# Patient Record
Sex: Male | Born: 1935 | Race: White | Hispanic: No | Smoking: Former smoker
Health system: Southern US, Community
[De-identification: ages and names within clinical notes are randomized; demographics above are authoritative.]

## PROBLEM LIST (undated history)

## (undated) DIAGNOSIS — G20A1 Parkinson's disease without dyskinesia, without mention of fluctuations: Secondary | ICD-10-CM

## (undated) DIAGNOSIS — K219 Gastro-esophageal reflux disease without esophagitis: Secondary | ICD-10-CM

## (undated) DIAGNOSIS — G20 Parkinson's disease: Secondary | ICD-10-CM

## (undated) HISTORY — PX: CATARACT EXTRACTION, BILATERAL: SHX1313

## (undated) HISTORY — PX: CARDIAC SURGERY: SHX584

---

## 2004-11-09 ENCOUNTER — Ambulatory Visit: Payer: Self-pay | Admitting: Gastroenterology

## 2005-03-29 ENCOUNTER — Ambulatory Visit: Payer: Self-pay | Admitting: Gastroenterology

## 2005-07-03 ENCOUNTER — Ambulatory Visit: Payer: Self-pay | Admitting: Family Medicine

## 2005-11-06 ENCOUNTER — Other Ambulatory Visit: Payer: Self-pay

## 2005-11-06 ENCOUNTER — Emergency Department: Payer: Self-pay | Admitting: Emergency Medicine

## 2006-01-02 ENCOUNTER — Ambulatory Visit: Payer: Self-pay | Admitting: Family Medicine

## 2006-04-03 ENCOUNTER — Ambulatory Visit: Payer: Self-pay | Admitting: Family Medicine

## 2006-08-14 ENCOUNTER — Ambulatory Visit: Payer: Self-pay | Admitting: Family Medicine

## 2006-11-13 ENCOUNTER — Ambulatory Visit: Payer: Self-pay | Admitting: Family Medicine

## 2009-09-02 ENCOUNTER — Ambulatory Visit: Payer: Self-pay | Admitting: Gastroenterology

## 2009-09-23 ENCOUNTER — Ambulatory Visit: Payer: Self-pay | Admitting: Orthopedic Surgery

## 2009-09-28 ENCOUNTER — Ambulatory Visit: Payer: Self-pay | Admitting: Gastroenterology

## 2010-01-28 ENCOUNTER — Ambulatory Visit: Payer: Self-pay | Admitting: Orthopedic Surgery

## 2010-03-01 ENCOUNTER — Ambulatory Visit: Payer: Self-pay | Admitting: Neurology

## 2010-07-06 ENCOUNTER — Ambulatory Visit: Payer: Self-pay | Admitting: Gastroenterology

## 2010-09-06 ENCOUNTER — Ambulatory Visit: Payer: Self-pay | Admitting: Urology

## 2011-02-28 ENCOUNTER — Ambulatory Visit: Payer: Self-pay | Admitting: Ophthalmology

## 2011-04-10 ENCOUNTER — Ambulatory Visit: Payer: Self-pay | Admitting: Gastroenterology

## 2011-04-12 LAB — PATHOLOGY REPORT

## 2011-06-11 ENCOUNTER — Ambulatory Visit: Payer: Self-pay | Admitting: Otolaryngology

## 2011-08-30 ENCOUNTER — Ambulatory Visit: Payer: Self-pay | Admitting: Gastroenterology

## 2011-10-10 ENCOUNTER — Encounter: Payer: Self-pay | Admitting: Otolaryngology

## 2011-10-29 ENCOUNTER — Observation Stay: Payer: Self-pay | Admitting: Internal Medicine

## 2011-10-29 LAB — CK TOTAL AND CKMB (NOT AT ARMC)
CK, Total: 79 U/L (ref 35–232)
CK-MB: 1 ng/mL (ref 0.5–3.6)

## 2011-10-29 LAB — CBC
MCH: 30.3 pg (ref 26.0–34.0)
MCHC: 32.8 g/dL (ref 32.0–36.0)
MCV: 92 fL (ref 80–100)
Platelet: 194 10*3/uL (ref 150–440)
RBC: 4.95 10*6/uL (ref 4.40–5.90)
WBC: 8.2 10*3/uL (ref 3.8–10.6)

## 2011-10-29 LAB — TROPONIN I: Troponin-I: <0.02 ng/mL

## 2011-10-29 LAB — BASIC METABOLIC PANEL
BUN: 14 mg/dL (ref 7–18)
Co2: 28 mmol/L (ref 21–32)
Creatinine: 0.87 mg/dL (ref 0.60–1.30)
EGFR (Non-African Amer.): >60
Glucose: 95 mg/dL (ref 65–99)
Osmolality: 289 (ref 275–301)

## 2011-10-30 DIAGNOSIS — R079 Chest pain, unspecified: Secondary | ICD-10-CM

## 2011-10-30 LAB — TROPONIN I: Troponin-I: <0.02 ng/mL

## 2011-10-30 LAB — LIPID PANEL
Ldl Cholesterol, Calc: 119 mg/dL — ABNORMAL HIGH (ref 0–100)
VLDL Cholesterol, Calc: 20 mg/dL (ref 5–40)

## 2011-10-30 LAB — HEMOGLOBIN A1C: Hemoglobin A1C: 6.5 % — ABNORMAL HIGH (ref 4.2–6.3)

## 2011-12-10 ENCOUNTER — Ambulatory Visit: Payer: Self-pay | Admitting: Gastroenterology

## 2012-01-29 ENCOUNTER — Ambulatory Visit: Payer: Self-pay | Admitting: Cardiology

## 2012-03-26 ENCOUNTER — Encounter: Payer: Self-pay | Admitting: Cardiology

## 2012-04-14 ENCOUNTER — Encounter: Payer: Self-pay | Admitting: Cardiology

## 2012-04-15 ENCOUNTER — Ambulatory Visit: Payer: Self-pay | Admitting: Internal Medicine

## 2012-04-15 LAB — CREATININE, SERUM
Creatinine: 1.18 mg/dL (ref 0.60–1.30)
EGFR (Non-African Amer.): 58 — ABNORMAL LOW

## 2012-05-07 DIAGNOSIS — G20 Parkinson's disease: Secondary | ICD-10-CM | POA: Insufficient documentation

## 2012-05-07 DIAGNOSIS — Z951 Presence of aortocoronary bypass graft: Secondary | ICD-10-CM | POA: Insufficient documentation

## 2012-05-07 DIAGNOSIS — I2510 Atherosclerotic heart disease of native coronary artery without angina pectoris: Secondary | ICD-10-CM | POA: Insufficient documentation

## 2012-05-15 ENCOUNTER — Encounter: Payer: Self-pay | Admitting: Cardiology

## 2012-05-25 ENCOUNTER — Emergency Department: Payer: Self-pay | Admitting: Emergency Medicine

## 2012-05-25 LAB — CK TOTAL AND CKMB (NOT AT ARMC): CK, Total: 74 U/L (ref 35–232)

## 2012-05-25 LAB — HEPATIC FUNCTION PANEL A (ARMC)
Alkaline Phosphatase: 103 U/L (ref 50–136)
Bilirubin, Direct: 0.1 mg/dL (ref 0.00–0.20)
Bilirubin,Total: 0.7 mg/dL (ref 0.2–1.0)
Total Protein: 8.2 g/dL (ref 6.4–8.2)

## 2012-05-25 LAB — BASIC METABOLIC PANEL
BUN: 14 mg/dL (ref 7–18)
Calcium, Total: 9 mg/dL (ref 8.5–10.1)
Chloride: 106 mmol/L (ref 98–107)
Co2: 26 mmol/L (ref 21–32)
Creatinine: 0.89 mg/dL (ref 0.60–1.30)
EGFR (African American): >60
EGFR (Non-African Amer.): >60
Potassium: 4.3 mmol/L (ref 3.5–5.1)
Sodium: 142 mmol/L (ref 136–145)

## 2012-05-25 LAB — CBC
HGB: 14.5 g/dL (ref 13.0–18.0)
MCHC: 33.7 g/dL (ref 32.0–36.0)
Platelet: 219 10*3/uL (ref 150–440)
RBC: 5.06 10*6/uL (ref 4.40–5.90)

## 2012-05-25 LAB — TROPONIN I: Troponin-I: <0.02 ng/mL

## 2012-06-15 ENCOUNTER — Encounter: Payer: Self-pay | Admitting: Cardiology

## 2012-06-17 ENCOUNTER — Ambulatory Visit: Payer: Self-pay | Admitting: Gastroenterology

## 2012-07-15 ENCOUNTER — Encounter: Payer: Self-pay | Admitting: Cardiology

## 2013-03-16 ENCOUNTER — Encounter: Payer: Self-pay | Admitting: Family Medicine

## 2013-04-01 ENCOUNTER — Ambulatory Visit: Payer: Self-pay | Admitting: Ophthalmology

## 2013-04-14 ENCOUNTER — Encounter: Payer: Self-pay | Admitting: Family Medicine

## 2013-05-15 ENCOUNTER — Encounter: Payer: Self-pay | Admitting: Family Medicine

## 2013-06-25 ENCOUNTER — Emergency Department: Payer: Self-pay | Admitting: Emergency Medicine

## 2013-06-25 LAB — CK TOTAL AND CKMB (NOT AT ARMC)
CK, Total: 79 U/L (ref 35–232)
CK-MB: <0.5 ng/mL — ABNORMAL LOW (ref 0.5–3.6)

## 2013-06-25 LAB — CBC
HCT: 43.4 % (ref 40.0–52.0)
HGB: 14.5 g/dL (ref 13.0–18.0)
MCHC: 33.5 g/dL (ref 32.0–36.0)
Platelet: 179 10*3/uL (ref 150–440)
WBC: 7.6 10*3/uL (ref 3.8–10.6)

## 2013-06-25 LAB — COMPREHENSIVE METABOLIC PANEL
Anion Gap: 5 — ABNORMAL LOW (ref 7–16)
Calcium, Total: 8.7 mg/dL (ref 8.5–10.1)
Chloride: 107 mmol/L (ref 98–107)
Creatinine: 0.92 mg/dL (ref 0.60–1.30)
EGFR (African American): >60
Glucose: 118 mg/dL — ABNORMAL HIGH (ref 65–99)
SGOT(AST): 25 U/L (ref 15–37)
SGPT (ALT): 22 U/L (ref 12–78)

## 2014-02-08 ENCOUNTER — Ambulatory Visit: Payer: Self-pay | Admitting: Podiatry

## 2014-04-12 ENCOUNTER — Encounter: Payer: Self-pay | Admitting: Podiatry

## 2014-04-12 ENCOUNTER — Telehealth: Payer: Self-pay | Admitting: *Deleted

## 2014-04-12 ENCOUNTER — Ambulatory Visit (INDEPENDENT_AMBULATORY_CARE_PROVIDER_SITE_OTHER): Payer: Medicare Other

## 2014-04-12 ENCOUNTER — Ambulatory Visit (INDEPENDENT_AMBULATORY_CARE_PROVIDER_SITE_OTHER): Payer: Medicare Other | Admitting: Podiatry

## 2014-04-12 VITALS — BP 139/65 | HR 83 | Resp 16 | Ht 69.0 in | Wt 173.0 lb

## 2014-04-12 DIAGNOSIS — G629 Polyneuropathy, unspecified: Secondary | ICD-10-CM

## 2014-04-12 DIAGNOSIS — G589 Mononeuropathy, unspecified: Secondary | ICD-10-CM

## 2014-04-12 DIAGNOSIS — B353 Tinea pedis: Secondary | ICD-10-CM

## 2014-04-12 MED ORDER — AMITRIPTYLINE HCL 150 MG PO TABS: ORAL_TABLET | ORAL | Status: DC

## 2014-04-12 NOTE — Progress Notes (Signed)
   Subjective:    Patient ID: Steven Roach, male    DOB: 12-06-1928, 80 y.o.   MRN: [id number]  HPI Comments: Both of my feet burn and hurt. Its been going on for a while. Its about the same. Hurts to walk and stand sometimes. i dont do anything for my feet  Foot Pain Associated symptoms include headaches and numbness.      Review of Systems  HENT: Positive for hearing loss.        Sinus problems Ringing in ears  Eyes: Positive for itching.  Endocrine:       Increase urination  Genitourinary: Positive for frequency.  Neurological: Positive for dizziness, light-headedness, numbness and headaches.  All other systems reviewed and are negative.      Objective:   Physical Exam I have reviewed his past history medications allergies surgeries social history and review of systems. Pulses are palpable bilateral. Neurologic sensorium is intact per since once the monofilament. Deep tendon reflexes are intact bilateral. Muscle strength is 5 over 5 dorsiflexors plantar flexors inverters everters all intrinsic musculature is intact. Orthopedic evaluation Mr. is all joints distal to the ankle a full range of motion without crepitation. Cutaneous evaluation demonstrates supple well hydrated cutis there is no erythema edema saline is drainage or odor. Nails are thick yellow dystrophic onychomycotic and painful.        Assessment & Plan:  Assessment: Neuritis/neuropathy forefoot bilateral. Pain in limb secondary to onychomycosis. Parkinson's disease.  Plan: Started him on Elavil 25 mg 1 by mouth QHS.

## 2014-04-12 NOTE — Telephone Encounter (Signed)
Pharmacy called questioning the dos of the medication dr Al Corpushyatt prescribed stating that he wrote for 150mg  of elavil ,we changed it from 150mg  to 25mg  once at nighttime

## 2014-05-10 ENCOUNTER — Ambulatory Visit: Payer: Medicare Other | Admitting: Podiatry

## 2014-05-14 ENCOUNTER — Emergency Department: Payer: Self-pay | Admitting: Emergency Medicine

## 2014-05-14 LAB — CBC WITH DIFFERENTIAL/PLATELET
BASOS PCT: 0.4 %
Basophil #: 0.1 10*3/uL (ref 0.0–0.1)
EOS ABS: 0.1 10*3/uL (ref 0.0–0.7)
EOS PCT: 0.7 %
HCT: 50.3 % (ref 40.0–52.0)
HGB: 15.9 g/dL (ref 13.0–18.0)
Lymphocyte #: 1 10*3/uL (ref 1.0–3.6)
Lymphocyte %: 6.7 %
MCH: 28.6 pg (ref 26.0–34.0)
MCHC: 31.6 g/dL — ABNORMAL LOW (ref 32.0–36.0)
MCV: 91 fL (ref 80–100)
Monocyte #: 0.8 x10 3/mm (ref 0.2–1.0)
Monocyte %: 5.6 %
Neutrophil #: 12.7 10*3/uL — ABNORMAL HIGH (ref 1.4–6.5)
Neutrophil %: 86.6 %
PLATELETS: 223 10*3/uL (ref 150–440)
RBC: 5.56 10*6/uL (ref 4.40–5.90)
RDW: 14.4 % (ref 11.5–14.5)
WBC: 14.6 10*3/uL — AB (ref 3.8–10.6)

## 2014-05-14 LAB — URINALYSIS, COMPLETE
BACTERIA: NONE SEEN
BILIRUBIN, UR: NEGATIVE
Glucose,UR: NEGATIVE mg/dL (ref 0–75)
Ketone: NEGATIVE
NITRITE: NEGATIVE
PROTEIN: NEGATIVE
Ph: 6 (ref 4.5–8.0)
RBC,UR: 24 /HPF (ref 0–5)
Specific Gravity: 1.013 (ref 1.003–1.030)
Squamous Epithelial: NONE SEEN
WBC UR: 334 /HPF (ref 0–5)

## 2014-05-14 LAB — BASIC METABOLIC PANEL
Anion Gap: 5 — ABNORMAL LOW (ref 7–16)
BUN: 14 mg/dL (ref 7–18)
CALCIUM: 9.6 mg/dL (ref 8.5–10.1)
CHLORIDE: 101 mmol/L (ref 98–107)
CO2: 30 mmol/L (ref 21–32)
Creatinine: 1.09 mg/dL (ref 0.60–1.30)
EGFR (African American): >60
EGFR (Non-African Amer.): >60
GLUCOSE: 105 mg/dL — AB (ref 65–99)
OSMOLALITY: 273 (ref 275–301)
Potassium: 4.2 mmol/L (ref 3.5–5.1)
Sodium: 136 mmol/L (ref 136–145)

## 2014-05-14 LAB — TROPONIN I: Troponin-I: <0.02 ng/mL

## 2014-06-14 ENCOUNTER — Emergency Department: Payer: Self-pay | Admitting: Emergency Medicine

## 2014-06-14 LAB — COMPREHENSIVE METABOLIC PANEL
ANION GAP: 5 — AB (ref 7–16)
AST: 24 U/L (ref 15–37)
Albumin: 3.8 g/dL (ref 3.4–5.0)
Alkaline Phosphatase: 136 U/L — ABNORMAL HIGH
BUN: 15 mg/dL (ref 7–18)
Bilirubin,Total: 1 mg/dL (ref 0.2–1.0)
CHLORIDE: 107 mmol/L (ref 98–107)
CO2: 29 mmol/L (ref 21–32)
CREATININE: 0.98 mg/dL (ref 0.60–1.30)
Calcium, Total: 8.9 mg/dL (ref 8.5–10.1)
EGFR (African American): >60
EGFR (Non-African Amer.): >60
Glucose: 100 mg/dL — ABNORMAL HIGH (ref 65–99)
Osmolality: 282 (ref 275–301)
POTASSIUM: 4.1 mmol/L (ref 3.5–5.1)
SGPT (ALT): 18 U/L
Sodium: 141 mmol/L (ref 136–145)
Total Protein: 7.6 g/dL (ref 6.4–8.2)

## 2014-06-14 LAB — CBC
HCT: 44.6 % (ref 40.0–52.0)
HGB: 14.4 g/dL (ref 13.0–18.0)
MCH: 29.2 pg (ref 26.0–34.0)
MCHC: 32.3 g/dL (ref 32.0–36.0)
MCV: 90 fL (ref 80–100)
Platelet: 192 10*3/uL (ref 150–440)
RBC: 4.94 10*6/uL (ref 4.40–5.90)
RDW: 14.3 % (ref 11.5–14.5)
WBC: 6.8 10*3/uL (ref 3.8–10.6)

## 2014-06-14 LAB — TROPONIN I: Troponin-I: <0.02 ng/mL

## 2014-06-14 LAB — LIPASE, BLOOD: Lipase: 151 U/L (ref 73–393)

## 2014-06-14 LAB — CK TOTAL AND CKMB (NOT AT ARMC)
CK, Total: 42 U/L
CK-MB: 0.7 ng/mL (ref 0.5–3.6)

## 2014-12-05 ENCOUNTER — Emergency Department: Payer: Self-pay | Admitting: Emergency Medicine

## 2015-02-06 NOTE — Discharge Summary (Signed)
PATIENT NAME:  Steven Roach, Steven Roach MR#:  [id number] DATE OF BIRTH:  May 08, 1929  DATE OF ADMISSION:  10/29/2011 DATE OF DISCHARGE:  10/30/2011  PRIMARY CARE PHYSICIAN:  Wonda ChengJoel Moffett, MD  REASON FOR ADMISSION: Chest pain.    DISCHARGE DIAGNOSES:   1. Chest pain, atypical, felt to be noncardiac possibly related to gastroesophageal reflux disease versus gastritis. The patient had negative cardiac enzymes x3 and negative cardiac stress test.  2. History of hyperlipidemia.   3. History of gastroesophageal reflux disease/gastritis.   4. History of gastritis 5. History of Parkinson'Roach disease.  6. History of type 2 diabetes mellitus.   CONSULTATION: Cardiology, Dr. Mariah MillingGollan.   PROCEDURES:  Portable chest x-ray 10/29/2011, no acute cardiopulmonary abnormalities are noted.   Lexiscan/Myoview 10/30/2011, no significant ischemia noted. Small region of thinning in the apical wall, likely secondary to attenuation artifact, normal ejection fraction of 78%. No EKG changes concerning for ischemia.  This is a low risk scan.   PERTINENT LABORATORY DATA: EKG on admission with normal sinus rhythm, heart rate 86% beats per minute without acute ST or T wave changes.   Cardiac enzymes are negative x3 sets.   BMP normal on admission.   CBC normal on admission.   Hemoglobin A1c is 6.5.   Liver panel: Total cholesterol 172, triglycerides 98, HDL 33, LDL 119.   BRIEF HISTORY/HOSPITAL COURSE: The patient is an 81 year old male with past medical history of hyperlipidemia, gastroesophageal reflux disease, gastritis, type 2 diabetes, Parkinson'Roach disease who presented to the emergency department with complaints of chest pain. Please see details of Admission History and Physical for pertinent details prior to the onset of this hospitalization. Please see below for further details.  1. Chest pain, atypical in nature. Initial EKG was obtained which was did not reveal any acute ST or T wave changes.  Chest x-ray was  unremarkable. First troponin was negative. Thereafter the patient was admitted to the telemetry unit for further evaluation and management. The patient was maintained on low-dose baby aspirin given his history of hyperlipidemia and diabetes mellitus for myocardial infarction prophylaxis. With conservative measures, patient'Roach chest pain had eventually resolved. The patient is ruled out for a myocardial infarction based on 3 negative sets of cardiac enzymes. Thereafter he was sent for a Myoview which was negative for ischemia and revealed normal LVEF and was a low-risk scan.  Cardiology consultation was obtained and the patient'Roach chest pain was felt to be noncardiac per Dr. Mariah MillingGollan and felt to be possibly related to gastroesophageal reflux disease versus gastritis. In the event of GERD versus gastritis related chest pain his PPI dose has been changed to b.i.d. rather than daily. The patient will be referred to Gastroenterology as an outpatient to see Dr Bluford Kaufmannh.    2. Hyperlipidemia, currently diet controlled.  LDL 119 and not at goal and the patient may need to be started on medications which he wishes to discuss with his primary care physician. Therefore, have advised him to continue to adhere to a low fat, low cholesterol diet for now and he will need to follow up with his primary care physician closely for further management of his hyperlipidemia.   3. Gastroesophageal reflux disease/gastritis as above.  This could be causing his chest pain and his PPI has been changed to b.i.d. for now and he will need prompt outpatient follow up with Gastroenterology and will see Dr. Bluford Kaufmannh in this regard.   4. Type 2 diabetes mellitus. This patient denied any history of such and on  his chart it was listed that he has diet-controlled diabetes mellitus. His hemoglobin A1c returned at 6.5, indicating good control; and he was advised to adhere to an ADA diet.  5. On 10/30/2011 the patient was hemodynamically stable and without any chest  pain or complaints, and he had ambulated well without any chest pain or shortness of breath and was felt to be stable for discharge home with close outpatient followup to which the patient was agreeable.   DISCHARGE DISPOSITION:  Home.   DISCHARGE CONDITION:  Improved, stable.    DISCHARGE ACTIVITIES:  As tolerated.    DISCHARGE DIET: Low sodium, low fat, low cholesterol, ADA.   DISCHARGE MEDICATIONS:  1. Aspirin 81 mg p.o. daily.  2. Omeprazole 20 mg p.o. b.i.d.  3. Carbidopa/levodopa 25 mg/100 mg 1 tab p.o. t.i.d.  4. Finasteride 5 mg p.o. daily.   5. Tamsulosin 0.4 mg p.o. daily.   DISCHARGE INSTRUCTIONS:  1. Take medications as prescribed.  2. Return to the emergency department for recurrence of symptoms or for development of nausea, vomiting, or abdominal pain or dark or black stools or bloody stools.   FOLLOWUP INSTRUCTIONS:  Follow up with Dr. Marguerite Olea within 1 to 2 weeks.   Follow up with Dr Bluford Kaufmann within 1 to 2 weeks.    TIME SPENT ON DISCHARGE: Greater than 30 minutes.     ____________________________ Steven Alas, MD knl:vtd D: 11/02/2011 18:31:07 ET T: 11/03/2011 11:44:21 ET JOB#: [id number]  cc: Steven Alas, MD, <Dictator> Durward Mallard Marguerite Olea, MD Ezzard Standing. Bluford Kaufmann, MD Steven Alas MD ELECTRONICALLY SIGNED 11/13/2011 18:46

## 2015-02-06 NOTE — Consult Note (Signed)
General Aspect 81 year old male with a history of Parkinson's disease, gastroesophageal reflux disease, diet-controlled diabetes and hyperlipidemia presenting with stuttering chest pain over the last week or two. Cardiology was called for chest pain.  He reports that he usually walks in the mall every day, and yesterday morning he started having this left-sided chest pain. It was a sharp chest pain jabbing into his chest radiating to his left arm. He was also slightly dizzy.   He has been having chest pains over the last 1 to 2 weeks. He tried to rest today and that helped his chest pain, but when he went home he continued to have some chest pain and decided to go to the Emergency Room.  In November of 2010, he had an echocardiogram done which showed that he had normal LV function, ejection fraction of 55%, mild left ventricular hypertrophy, moderate mitral valve prolapse, and mild-to-moderate MR and AR. Possible stress test at that time.    He also started taking omeprazole about three days ago because he was having some heartburn. His chest pain on presentation was different from his heartburn. He had an upper GI endoscopy done in June of 2012 which showed that he had a benign-appearing esophageal stricture which was dilated, and he had some gastric erythema.    Present Illness . PAST MEDICAL HISTORY:  1. Parkinson's disease. He does not have any tremor but he has some gait problems.  2. Hyperlipidemia on dietary therapy.  3. Diabetes on dietary therapy. His last A1c in June of 2012 was 6.3.  4. Gastroesophageal reflux disease. He had a recent upper GI endoscopy which showed gastric erythema and benign-appearing esophageal stricture.   PAST SURGICAL HISTORY: None.   ALLERGIES TO MEDICATIONS: None.   SOCIAL HISTORY: He lives with his wife in [place]. No smoking or alcohol use.   FAMILY HISTORY: His brother had a stroke in 7650s. Mother had heart disease in 860s to 4170s.   Physical Exam:    GEN WD, WN, NAD    HEENT red conjunctivae    NECK supple    RESP normal resp effort  clear BS    CARD Regular rate and rhythm  Murmur    Murmur Systolic    Systolic Murmur Out flow    ABD denies tenderness  soft    LYMPH negative neck    EXTR negative edema    SKIN normal to palpation    NEURO motor/sensory function intact    PSYCH alert, A+O to time, place, person, good insight   Review of Systems:   Subjective/Chief Complaint Chest pain    General: No Complaints    Skin: No Complaints    ENT: No Complaints    Eyes: No Complaints    Neck: No Complaints    Respiratory: No Complaints    Cardiovascular: Chest pain or discomfort    Gastrointestinal: No Complaints    Genitourinary: No Complaints    Vascular: No Complaints    Musculoskeletal: No Complaints    Neurologic: No Complaints    Hematologic: No Complaints    Endocrine: No Complaints    Psychiatric: No Complaints    Review of Systems: All other systems were reviewed and found to be negative    Medications/Allergies Reviewed Medications/Allergies reviewed        Admit Diagnosis:   CHEST PAIN PARKINSONS COPD DM GERD: 30-Oct-2011, Active, CHEST PAIN PARKINSONS COPD DM GERD  Home Medications:  carbidopa-levodopa 25 mg-100 mg oral tablet: 1 tab(s) orally 3  times a day, Active  finasteride 5 mg oral tablet: 1 tab(s) orally once a day, Active  tamsulosin 0.4 mg oral capsule: 1 cap(s) orally once a day 1/2 hour after a meal., Active  Bayer Low Dose 81 mg oral tablet: 1 tab(s) orally once a day, Active  omeprazole 20 mg oral delayed release tablet: 1 tab(s) orally once a day, Active    Cardiac:  15-Jan-13 05:00    Troponin I < 0.02   CK, Total 115   CPK-MB, Serum 1.4  Routine Chem:  15-Jan-13 05:00    Cholesterol, Serum 172   Triglycerides, Serum 98   HDL (INHOUSE) 33   VLDL Cholesterol Calculated 20   LDL Cholesterol Calculated 119   Hemoglobin A1c [facility]) 6.5  Cardiology:   15-Jan-13 09:24    Protocol LEXISCAN   Max Work Load 10   Total Exercise Time 61   Max Diastolic BP 63   Max Heart Rate 96   Max Predicted Heart Rate 140   EKG:   Interpretation EKG shows NSR with rate 88 bpm, no significant ST or T wave changes   Radiology Results: XRay:    14-Jan-13 12:18, Chest Portable Single View   Chest Portable Single View    REASON FOR EXAM:    chest pain  COMMENTS:       PROCEDURE: DXR - DXR PORTABLE CHEST SINGLE VIEW  - Oct 29 2011 12:18PM     RESULT: Comparison: None    Findings:     Single portable AP chest radiograph is provided.  There is no focal   parenchymal opacity, pleural effusion, or pneumothorax. Normal   cardiomediastinal silhouette. The osseous structures are unremarkable.    IMPRESSION:     No acute disease of the chest.          Verified By: Joellyn Haff, M.D., MD    No Known Allergies:   Vital Signs/Nurse's Notes: **Vital Signs.:   15-Jan-13 08:32   Vital Signs Type Routine   Temperature Temperature (F) 97.6   Celsius 36.4   Temperature Source oral   Pulse Pulse 69   Pulse source per Dinamap   Respirations Respirations 18   Systolic BP Systolic BP 147   Diastolic BP (mmHg) Diastolic BP (mmHg) 80   Mean BP 102   Pulse Ox % Pulse Ox % 94   Pulse Ox Activity Level  At rest   Oxygen Delivery Room Air/ 21 %     Impression 81 year old male with a history of Parkinson's disease, gastroesophageal reflux disease, diet-controlled diabetes and hyperlipidemia presenting with stuttering chest pain over the past week or two.  Symptoms concerning for angina as they seem to occur with stress.  Stress test pending this AM. Currently pain free. cardiac enz and EKG normal. No prior cardiac hx.  If stress test negative, could potential d/c with follow up in our office. Unable to exclude GI etiology or coronary spasm (He does have a prior GI hx with stricture.) --Consider starting ASA 81 mg daily, low dose b-blocker,  statin   Electronic Signatures: Julien Nordmann (MD)  (Signed 15-Jan-13 19:11)  Authored: General Aspect/Present Illness, History and Physical Exam, Review of System, Health Issues, Home Medications, Labs, EKG , Radiology, Allergies, Vital Signs/Nurse's Notes, Impression/Plan   Last Updated: 15-Jan-13 19:11 by Julien Nordmann (MD)

## 2015-02-06 NOTE — H&P (Signed)
PATIENT NAME:  Steven Roach, Steven Roach MR#:  [id number] DATE OF BIRTH:  [birth year]  DATE OF ADMISSION:  10/29/2011  PRIMARY CARE PHYSICIAN: Francis DowseJoel B. Marguerite OleaMoffett, MD   CHIEF COMPLAINT: Chest pain.   HISTORY OF PRESENT ILLNESS: The patient is an 81 year old male with a history of Parkinson'Roach disease, gastroesophageal reflux disease, diet-controlled diabetes and hyperlipidemia. Today he presented to the Emergency Room. He says he usually walks in the mall every day, and then when he started walking this morning he started having this left-sided chest pain. He feels sharp-like chest pain jabbing into his chest radiating to his left arm. He was slightly dizzy also. He denies any shortness of breath, nausea, or diaphoresis. He says he has been having these chest pains often for the last 1 to 2 weeks. He tried to rest today and that helped his chest pain, but when he went home he continued to have some chest pain so he presented to the Emergency Room. He says the chest pain is going off and on for about one or two weeks. It happens sometimes when he walks, also, and he usually rests. He had no prior history of coronary artery disease or myocardial infarction in the past. He said he had a stress test a long time ago and that was normal. He last saw Dr. Darrold JunkerParaschos in November of 2010, and he had an echocardiogram done which showed that he had normal LV function, ejection fraction of 55%, mild left ventricular hypertrophy, moderate mitral valve prolapse, and mild-to-moderate MR and AR. He got about 324 mg of aspirin in the Emergency Room and 2 mg of IV morphine, and he still has some lingering chest pain. He denies any pleuritic chest pain. He denies any cough, denies any abdominal pain or nausea. He also started taking omeprazole about three days ago because he was having some heartburn. He said his chest pain today was different from his heartburn. He had an upper GI endoscopy done in June of 2012 which showed that he had a  benign-appearing esophageal stricture which was dilated, and he had some gastric erythema. So he is being admitted for chest pain with some typical features, with underlying Parkinson'Roach disease, hyperlipidemia, and diet-controlled diabetes.   REVIEW OF SYSTEMS: CONSTITUTIONAL: He denies any fever, weakness. HEENT: No acute change in vision. He has some mild headache. He also has some dizziness because of his Parkinson'Roach disease. RESPIRATORY: No cough. No dyspnea. CARDIOVASCULAR: Comes in with chest pain. No dyspnea on exertion. No palpitations or syncope. GENITOURINARY: He denies any dysuria, any frequency. GASTROINTESTINAL: No nausea, vomiting, abdominal pain. ENDOCRINE: No thyroid problems. HEMATOLOGIC: No anemia. No bleeding from any site. INTEGUMENTARY:  No rash. MUSCULOSKELETAL: No joint pains or swelling. NEUROLOGICAL:  He has Parkinson disease.  PSYCHIATRIC: No anxiety or depression.   PAST MEDICAL HISTORY:  1. Parkinson'Roach disease. He does not have any tremor but he has some gait problems.  2. Hyperlipidemia on dietary therapy.  3. Diabetes on dietary therapy. His last A1c in June of 2012 was 6.3.  4. Gastroesophageal reflux disease. He had a recent upper GI endoscopy which showed gastric erythema and benign-appearing esophageal stricture.   PAST SURGICAL HISTORY: None.   ALLERGIES TO MEDICATIONS: None.   HOME MEDICATIONS:  1. Aspirin 81 mg daily.  2. Carbidopa/levodopa 25/100 t.i.d. 3. Finasteride 5 mg daily. 4. Tamsulosin 0.4 mg once a day after meals.  5. Omeprazole 20 mg daily, recently started.   SOCIAL HISTORY: He lives with his wife in [place].  No smoking or alcohol use.   FAMILY HISTORY: His brother had a stroke in 33s. Mother had heart disease in 82s to 44s.   PHYSICAL EXAMINATION:  VITAL SIGNS: When he presented to the Emergency Room, temperature was 98.2, heart rate 89, respiratory rate 20, blood pressure 171/78, saturating 97% on room air.   GENERAL: This is an  elderly Caucasian male. He is well built, comfortably lying in bed in no acute distress.   HEENT: Bilateral pupils are equal and reactive to light. Extraocular muscles are intact. No scleral icterus. No conjunctivitis. Oral mucosa is moist. No pallor.   NECK: No thyroid tenderness, enlargement or nodule. Neck is supple. No masses, nontender. No adenopathy. No JVD. No carotid bruit.   CHEST: Bilateral breath sounds are clear. No wheeze. Normal effort. No respiratory distress.  HEART: Heart sounds are regular. No murmur. Good peripheral pulses. No lower extremity edema.   ABDOMEN: Soft, nontender. Normal bowel sounds. No hepatosplenomegaly. No bruit. No masses.   RECTAL: Exam is deferred.   NEUROLOGICAL: He is awake, alert, oriented to time, place, and person. Good insight. Cranial nerves are intact. Moving all extremities against gravity. No cyanosis, no clubbing, no tremors.   SKIN: No rash, no lesions.   LABORATORY, DIAGNOSTIC AND RADIOLOGICAL DATA:  White count of 8.2, hemoglobin 15, platelet count 194,000.  BMP: Sodium 145, potassium 4.3, BUN 14, creatinine 0.8.  Troponin is negative.  EKG shows that he has sinus rhythm, normal EKG.   IMPRESSION:  1. Chest pain with some typical features, rule out myocardial infarction, rule out coronary artery disease.  2. Parkinson'Roach disease.  3. Gastroesophageal reflux disease. 4. Hyperlipidemia, diet controlled.  5. Diabetes, diet controlled.   PLAN: The patient is an 81 year old male who basically only has Parkinson'Roach disease. He controls his diabetes and hyperlipidemia with diet. He also has some acid reflux. He had upper GI endoscopy done in June of 2012 which showed that he has some gastric erythema, and he had a benign-appearing esophageal stricture. He recently started on PPI also. He comes in with chest pain that started when he was walking in the mall and then relieved at rest. He has been having these chest pains for the last 1 to 2  weeks, he says, more with exertion. He got 324 mg of aspirin in the Emergency Room. We will continue that, and we will start him on some nitro paste. We will give him a low-dose beta blocker. We will hold off Lovenox at this time unless his cardiac enzymes become positive. We will do serial cardiac enzymes on him, and if his enzymes are negative we will get a treadmill Myoview in the morning. He is requesting Dr. Mariah Milling to be his Cardiology consultant, and we will order that. We will continue his omeprazole, carbidopa/levodopa, finasteride and tamsulosin.  The plan was discussed with the patient in detail. I will admit him to observation telemetry.    TIME SPENT:   Time spent with admission and coordination of care was 50 minutes.    ____________________________ Fredia Sorrow, MD ag:cbb D: 10/29/2011 15:02:13 ET T: 10/29/2011 16:02:32 ET JOB#: [id number]  cc: Fredia Sorrow, MD, <Dictator> Durward Mallard. Marguerite Olea, MD Fredia Sorrow MD ELECTRONICALLY SIGNED 11/09/2011 10:53

## 2015-07-16 ENCOUNTER — Encounter: Payer: Self-pay | Admitting: Emergency Medicine

## 2015-07-16 ENCOUNTER — Emergency Department
Admission: EM | Admit: 2015-07-16 | Discharge: 2015-07-16 | Disposition: A | Discharge status: Home / Self Care | Payer: Medicare Other | Attending: Emergency Medicine | Admitting: Emergency Medicine

## 2015-07-16 ENCOUNTER — Emergency Department: Payer: Medicare Other

## 2015-07-16 DIAGNOSIS — Y998 Other external cause status: Secondary | ICD-10-CM | POA: Insufficient documentation

## 2015-07-16 DIAGNOSIS — S161XXA Strain of muscle, fascia and tendon at neck level, initial encounter: Secondary | ICD-10-CM | POA: Diagnosis not present

## 2015-07-16 DIAGNOSIS — Y92481 Parking lot as the place of occurrence of the external cause: Secondary | ICD-10-CM | POA: Insufficient documentation

## 2015-07-16 DIAGNOSIS — W01198A Fall on same level from slipping, tripping and stumbling with subsequent striking against other object, initial encounter: Secondary | ICD-10-CM | POA: Insufficient documentation

## 2015-07-16 DIAGNOSIS — S0003XA Contusion of scalp, initial encounter: Secondary | ICD-10-CM | POA: Diagnosis not present

## 2015-07-16 DIAGNOSIS — Y9301 Activity, walking, marching and hiking: Secondary | ICD-10-CM | POA: Insufficient documentation

## 2015-07-16 DIAGNOSIS — W19XXXA Unspecified fall, initial encounter: Secondary | ICD-10-CM

## 2015-07-16 DIAGNOSIS — S0990XA Unspecified injury of head, initial encounter: Secondary | ICD-10-CM | POA: Diagnosis present

## 2015-07-16 HISTORY — DX: Gastro-esophageal reflux disease without esophagitis: K21.9

## 2015-07-16 HISTORY — DX: Parkinson's disease without dyskinesia, without mention of fluctuations: G20.A1

## 2015-07-16 HISTORY — DX: Parkinson's disease: G20

## 2015-07-16 MED ORDER — ACETAMINOPHEN 500 MG PO TABS
1000.0000 mg | ORAL_TABLET | Freq: Once | ORAL | Status: AC
  Administered 2015-07-16: 1000 mg via ORAL
  Filled 2015-07-16: qty 2

## 2015-07-16 NOTE — ED Provider Notes (Signed)
[facility] Emergency Department Provider Note     Time seen: ----------------------------------------- 2:21 PM on 07/16/2015 -----------------------------------------    I have reviewed the triage vital signs and the nursing notes.   HISTORY  Chief Complaint Fall    HPI Steven Roach is a 81 y.o. male who presents to the ER after a fall. Patient fell back and hit his head on concrete. He was walking across a parking lot with his wife and lost his balance. States he thinks he just got ahead of himself walking. Denies any other complaints of an head and neck pain. This happened just prior to arrival   No past medical history on file.  There are no active problems to display for this patient.   No past surgical history on file.  Allergies Review of patient's allergies indicates no known allergies.  Social History Social History  Substance Use Topics  . Smoking status: Never Smoker   . Smokeless tobacco: Not on file  . Alcohol Use: No    Review of Systems Constitutional: Negative for fever. Eyes: Negative for visual changes. ENT: Negative for sore throat. Cardiovascular: Negative for chest pain. Respiratory: Negative for shortness of breath. Gastrointestinal: Negative for abdominal pain, vomiting and diarrhea. Genitourinary: Negative for dysuria. Musculoskeletal: Negative for back pain. Skin: Negative for rash. Neurological: Positive for headache, negative for focal weakness or numbness.  10-point ROS otherwise negative.  ____________________________________________   PHYSICAL EXAM:  VITAL SIGNS: ED Triage Vitals  Enc Vitals Group     BP --      Pulse --      Resp --      Temp --      Temp src --      SpO2 --      Weight --      Height --      Head Cir --      Peak Flow --      Pain Score --      Pain Loc --      Pain Edu? --      Excl. in GC? --     Constitutional: Alert and oriented. Well appearing and in no  distress. Patient brought in C-spine immobilized Eyes: Conjunctivae are normal. PERRL. Normal extraocular movements. ENT   Head: Posterior scalp hematoma   Nose: No congestion/rhinnorhea.   Mouth/Throat: Mucous membranes are moist.   Neck: No stridor. Posterior C-spine tenderness Cardiovascular: Normal rate, regular rhythm. Normal and symmetric distal pulses are present in all extremities. No murmurs, rubs, or gallops. Respiratory: Normal respiratory effort without tachypnea nor retractions. Breath sounds are clear and equal bilaterally. No wheezes/rales/rhonchi. Gastrointestinal: Soft and nontender. No distention. No abdominal bruits.  Musculoskeletal: Nontender with normal range of motion in all extremities. No joint effusions.  No lower extremity tenderness nor edema. Neurologic:  Normal speech and language. No gross focal neurologic deficits are appreciated. Speech is normal.  Skin:  Skin is warm, dry and intact. No rash noted. Psychiatric: Mood and affect are normal. Speech and behavior are normal. Patient exhibits appropriate insight and judgment.  ____________________________________________  ED COURSE:  EKG: Normal sinus rhythm with incomplete right bundle branch block, rate is 85 bpm, normal axis. No evidence of acute infarction. Interpreted by me  Pertinent labs & imaging results that were available during my care of the patient were reviewed by me and considered in my medical decision making (see chart for details). We'll obtain CT imaging. ____________________________________________  RADIOLOGY Images were viewed  by me  CT head, C-spine  IMPRESSION: 1. 1. Negative for bleed or other acute intracranial process. 2. No acute cervical spine abnormality. 3. Atrophy and nonspecific white matter changes.  4. Right frontal sinus disease 5. Cervical spondylitic changes C4-5 as above. 6. Bilateral calcified carotid  plaque.  ____________________________________________  FINAL ASSESSMENT AND PLAN  Fall, head injury, cervical strain  Plan: Patient with imaging as dictated above. Patient is stable for outpatient follow-up with his doctor, has no focal neurologic complaints this time.   Emily Filbert, MD   Emily Filbert, MD 07/16/15 [phone]

## 2015-07-16 NOTE — ED Notes (Signed)
Pt [place] via EMS from apartment complex. Pt was walking across parking lot and per wife he "got ahead of himself" and witnessed him falling. She tried to catch him, but he fell on the back of his head.  Wife told EMS she heard a "splat"  Patient is alert and awake on arrival and in a c-collar

## 2015-07-16 NOTE — Discharge Instructions (Signed)
Head Injury  You have received a head injury. It does not appear serious at this time. Headaches and vomiting are common following head injury. It should be easy to awaken from sleeping. Sometimes it is necessary for you to stay in the emergency department for a while for observation. Sometimes admission to the hospital may be needed. After injuries such as yours, most problems occur within the first 24 hours, but side effects may occur up to 7-10 days after the injury. It is important for you to carefully monitor your condition and contact your health care provider or seek immediate medical care if there is a change in your condition.  WHAT ARE THE TYPES OF HEAD INJURIES?  Head injuries can be as minor as a bump. Some head injuries can be more severe. More severe head injuries include:   A jarring injury to the brain (concussion).   A bruise of the brain (contusion). This mean there is bleeding in the brain that can cause swelling.   A cracked skull (skull fracture).   Bleeding in the brain that collects, clots, and forms a bump (hematoma).  WHAT CAUSES A HEAD INJURY?  A serious head injury is most likely to happen to someone who is in a car wreck and is not wearing a seat belt. Other causes of major head injuries include bicycle or motorcycle accidents, sports injuries, and falls.  HOW ARE HEAD INJURIES DIAGNOSED?  A complete history of the event leading to the injury and your current symptoms will be helpful in diagnosing head injuries. Many times, pictures of the brain, such as CT or MRI are needed to see the extent of the injury. Often, an overnight hospital stay is necessary for observation.   WHEN SHOULD I SEEK IMMEDIATE MEDICAL CARE?   You should get help right away if:   You have confusion or drowsiness.   You feel sick to your stomach (nauseous) or have continued, forceful vomiting.   You have dizziness or unsteadiness that is getting worse.   You have severe, continued headaches not relieved by  medicine. Only take over-the-counter or prescription medicines for pain, fever, or discomfort as directed by your health care provider.   You do not have normal function of the arms or legs or are unable to walk.   You notice changes in the black spots in the center of the colored part of your eye (pupil).   You have a clear or bloody fluid coming from your nose or ears.   You have a loss of vision.  During the next 24 hours after the injury, you must stay with someone who can watch you for the warning signs. This person should contact local emergency services (911 in the [place] if you have seizures, you become unconscious, or you are unable to wake up.  HOW CAN I PREVENT A HEAD INJURY IN THE FUTURE?  The most important factor for preventing major head injuries is avoiding motor vehicle accidents. To minimize the potential for damage to your head, it is crucial to wear seat belts while riding in motor vehicles. Wearing helmets while bike riding and playing collision sports (like football) is also helpful. Also, avoiding dangerous activities around the house will further help reduce your risk of head injury.   WHEN CAN I RETURN TO NORMAL ACTIVITIES AND ATHLETICS?  You should be reevaluated by your health care provider before returning to these activities. If you have any of the following symptoms, you should not return to activities   or contact sports until 1 week after the symptoms have stopped:   Persistent headache.   Dizziness or vertigo.   Poor attention and concentration.   Confusion.   Memory problems.   Nausea or vomiting.   Fatigue or tire easily.   Irritability.   Intolerant of bright lights or loud noises.   Anxiety or depression.   Disturbed sleep.  MAKE SURE YOU:    Understand these instructions.   Will watch your condition.   Will get help right away if you are not doing well or get worse.  Document Released: 10/01/2005 Document Revised: 10/06/2013 Document Reviewed:  06/08/2013  ExitCare Patient Information 2015 [employer]. This information is not intended to replace advice given to you by your health care provider. Make sure you discuss any questions you have with your health care provider.    Fall Prevention and Home Safety  Falls cause injuries and can affect all age groups. It is possible to use preventive measures to significantly decrease the likelihood of falls. There are many simple measures which can make your home safer and prevent falls.  OUTDOORS   Repair cracks and edges of walkways and driveways.   Remove high doorway thresholds.   Trim shrubbery on the main path into your home.   Have good outside lighting.   Clear walkways of tools, rocks, debris, and clutter.   Check that handrails are not broken and are securely fastened. Both sides of steps should have handrails.   Have leaves, snow, and ice cleared regularly.   Use sand or salt on walkways during winter months.   In the garage, clean up grease or oil spills.  BATHROOM   Install night lights.   Install grab bars by the toilet and in the tub and shower.   Use non-skid mats or decals in the tub or shower.   Place a plastic non-slip stool in the shower to sit on, if needed.   Keep floors dry and clean up all water on the floor immediately.   Remove soap buildup in the tub or shower on a regular basis.   Secure bath mats with non-slip, double-sided rug tape.   Remove throw rugs and tripping hazards from the floors.  BEDROOMS   Install night lights.   Make sure a bedside light is easy to reach.   Do not use oversized bedding.   Keep a telephone by your bedside.   Have a firm chair with side arms to use for getting dressed.   Remove throw rugs and tripping hazards from the floor.  KITCHEN   Keep handles on pots and pans turned toward the center of the stove. Use back burners when possible.   Clean up spills quickly and allow time for drying.   Avoid walking on wet floors.   Avoid hot  utensils and knives.   Position shelves so they are not too high or low.   Place commonly used objects within easy reach.   If necessary, use a sturdy step stool with a grab bar when reaching.   Keep electrical cables out of the way.   Do not use floor polish or wax that makes floors slippery. If you must use wax, use non-skid floor wax.   Remove throw rugs and tripping hazards from the floor.  STAIRWAYS   Never leave objects on stairs.   Place handrails on both sides of stairways and use them. Fix any loose handrails. Make sure handrails on both sides of the stairways   are as long as the stairs.   Check carpeting to make sure it is firmly attached along stairs. Make repairs to worn or loose carpet promptly.   Avoid placing throw rugs at the top or bottom of stairways, or properly secure the rug with carpet tape to prevent slippage. Get rid of throw rugs, if possible.   Have an electrician put in a light switch at the top and bottom of the stairs.  OTHER FALL PREVENTION TIPS   Wear low-heel or rubber-soled shoes that are supportive and fit well. Wear closed toe shoes.   When using a stepladder, make sure it is fully opened and both spreaders are firmly locked. Do not climb a closed stepladder.   Add color or contrast paint or tape to grab bars and handrails in your home. Place contrasting color strips on first and last steps.   Learn and use mobility aids as needed. Install an electrical emergency response system.   Turn on lights to avoid dark areas. Replace light bulbs that burn out immediately. Get light switches that glow.   Arrange furniture to create clear pathways. Keep furniture in the same place.   Firmly attach carpet with non-skid or double-sided tape.   Eliminate uneven floor surfaces.   Select a carpet pattern that does not visually hide the edge of steps.   Be aware of all pets.  OTHER HOME SAFETY TIPS   Set the water temperature for 120 F (48.8 C).   Keep emergency numbers on  or near the telephone.   Keep smoke detectors on every level of the home and near sleeping areas.  Document Released: 09/21/2002 Document Revised: 04/01/2012 Document Reviewed: 12/21/2011  [employer] Patient Information 2015 [employer]. This information is not intended to replace advice given to you by your health care provider. Make sure you discuss any questions you have with your health care provider.

## 2015-07-16 NOTE — ED Notes (Signed)
AAOx3.  Skin warm and dry. Moving all extremities equally and strong.  NAD 

## 2015-11-29 ENCOUNTER — Encounter: Payer: Self-pay | Admitting: Emergency Medicine

## 2015-11-29 ENCOUNTER — Emergency Department: Payer: Medicare Other

## 2015-11-29 ENCOUNTER — Emergency Department
Admission: EM | Admit: 2015-11-29 | Discharge: 2015-11-30 | Disposition: A | Discharge status: Home / Self Care | Payer: Medicare Other | Attending: Emergency Medicine | Admitting: Emergency Medicine

## 2015-11-29 DIAGNOSIS — Z87891 Personal history of nicotine dependence: Secondary | ICD-10-CM | POA: Insufficient documentation

## 2015-11-29 DIAGNOSIS — R1110 Vomiting, unspecified: Secondary | ICD-10-CM | POA: Diagnosis present

## 2015-11-29 DIAGNOSIS — R112 Nausea with vomiting, unspecified: Secondary | ICD-10-CM | POA: Diagnosis not present

## 2015-11-29 DIAGNOSIS — R1084 Generalized abdominal pain: Secondary | ICD-10-CM | POA: Diagnosis not present

## 2015-11-29 DIAGNOSIS — Z7982 Long term (current) use of aspirin: Secondary | ICD-10-CM | POA: Diagnosis not present

## 2015-11-29 DIAGNOSIS — Z79899 Other long term (current) drug therapy: Secondary | ICD-10-CM | POA: Insufficient documentation

## 2015-11-29 LAB — COMPREHENSIVE METABOLIC PANEL
ALBUMIN: 4 g/dL (ref 3.5–5.0)
ALK PHOS: 72 U/L (ref 38–126)
ALT: 5 U/L — AB (ref 17–63)
ANION GAP: 5 (ref 5–15)
AST: 17 U/L (ref 15–41)
BILIRUBIN TOTAL: 1.1 mg/dL (ref 0.3–1.2)
BUN: 15 mg/dL (ref 6–20)
CALCIUM: 8.6 mg/dL — AB (ref 8.9–10.3)
CO2: 31 mmol/L (ref 22–32)
Chloride: 103 mmol/L (ref 101–111)
Creatinine, Ser: 0.95 mg/dL (ref 0.61–1.24)
GFR calc Af Amer: >60 mL/min (ref >60)
GFR calc non Af Amer: >60 mL/min (ref >60)
GLUCOSE: 114 mg/dL — AB (ref 65–99)
Potassium: 4.3 mmol/L (ref 3.5–5.1)
SODIUM: 139 mmol/L (ref 135–145)
TOTAL PROTEIN: 7 g/dL (ref 6.5–8.1)

## 2015-11-29 LAB — URINALYSIS COMPLETE WITH MICROSCOPIC (ARMC ONLY)
Bilirubin Urine: NEGATIVE
Glucose, UA: NEGATIVE mg/dL
Hgb urine dipstick: NEGATIVE
Leukocytes, UA: NEGATIVE
NITRITE: NEGATIVE
Protein, ur: NEGATIVE mg/dL
SPECIFIC GRAVITY, URINE: 1.018 (ref 1.005–1.030)
Squamous Epithelial / LPF: NONE SEEN
pH: 7 (ref 5.0–8.0)

## 2015-11-29 LAB — CBC
HEMATOCRIT: 46.2 % (ref 40.0–52.0)
HEMOGLOBIN: 15.3 g/dL (ref 13.0–18.0)
MCH: 29.2 pg (ref 26.0–34.0)
MCHC: 33 g/dL (ref 32.0–36.0)
MCV: 88.3 fL (ref 80.0–100.0)
Platelets: 228 10*3/uL (ref 150–440)
RBC: 5.23 MIL/uL (ref 4.40–5.90)
RDW: 14.2 % (ref 11.5–14.5)
WBC: 8.9 10*3/uL (ref 3.8–10.6)

## 2015-11-29 LAB — LIPASE, BLOOD: Lipase: 24 U/L (ref 11–51)

## 2015-11-29 LAB — TROPONIN I: Troponin I: <0.03 ng/mL (ref <0.031)

## 2015-11-29 MED ORDER — IOHEXOL 240 MG/ML SOLN
25.0000 mL | Freq: Once | INTRAMUSCULAR | Status: AC | PRN
  Administered 2015-11-29: 25 mL via ORAL

## 2015-11-29 MED ORDER — ONDANSETRON HCL 4 MG/2ML IJ SOLN
4.0000 mg | Freq: Once | INTRAMUSCULAR | Status: AC | PRN
  Administered 2015-11-29: 4 mg via INTRAVENOUS

## 2015-11-29 MED ORDER — IOHEXOL 350 MG/ML SOLN
100.0000 mL | Freq: Once | INTRAVENOUS | Status: AC | PRN
  Administered 2015-11-29: 100 mL via INTRAVENOUS

## 2015-11-29 MED ORDER — ONDANSETRON HCL 4 MG/2ML IJ SOLN
INTRAMUSCULAR | Status: AC
  Administered 2015-11-29: 4 mg via INTRAVENOUS
  Filled 2015-11-29: qty 2

## 2015-11-29 NOTE — ED Notes (Addendum)
Pt to ed with c/o vomiting since Sunday.  Pt reports poor intake and nausea.  Pt denies diarrhea. Pt also reports upper abd pain and burning.

## 2015-11-29 NOTE — Discharge Instructions (Signed)
Your exam and evaluation are reassuring in the emergency department site. Return to the emergency room for any worsening condition including any worsening abdominal pain, black or bloody vomiting, black or bloody stools, fever, trouble breathing, or any other symptoms concerning to you.   Nausea and Vomiting Nausea is a sick feeling that often comes before throwing up (vomiting). Vomiting is a reflex where stomach contents come out of your mouth. Vomiting can cause severe loss of body fluids (dehydration). Children and elderly adults can become dehydrated quickly, especially if they also have diarrhea. Nausea and vomiting are symptoms of a condition or disease. It is important to find the cause of your symptoms. CAUSES   Direct irritation of the stomach lining. This irritation can result from increased acid production (gastroesophageal reflux disease), infection, food poisoning, taking certain medicines (such as nonsteroidal anti-inflammatory drugs), alcohol use, or tobacco use.  Signals from the brain.These signals could be caused by a headache, heat exposure, an inner ear disturbance, increased pressure in the brain from injury, infection, a tumor, or a concussion, pain, emotional stimulus, or metabolic problems.  An obstruction in the gastrointestinal tract (bowel obstruction).  Illnesses such as diabetes, hepatitis, gallbladder problems, appendicitis, kidney problems, cancer, sepsis, atypical symptoms of a heart attack, or eating disorders.  Medical treatments such as chemotherapy and radiation.  Receiving medicine that makes you sleep (general anesthetic) during surgery. DIAGNOSIS Your caregiver may ask for tests to be done if the problems do not improve after a few days. Tests may also be done if symptoms are severe or if the reason for the nausea and vomiting is not clear. Tests may include:  Urine tests.  Blood tests.  Stool tests.  Cultures (to look for evidence of  infection).  X-rays or other imaging studies. Test results can help your caregiver make decisions about treatment or the need for additional tests. TREATMENT You need to stay well hydrated. Drink frequently but in small amounts.You may wish to drink water, sports drinks, clear broth, or eat frozen ice pops or gelatin dessert to help stay hydrated.When you eat, eating slowly may help prevent nausea.There are also some antinausea medicines that may help prevent nausea. HOME CARE INSTRUCTIONS   Take all medicine as directed by your caregiver.  If you do not have an appetite, do not force yourself to eat. However, you must continue to drink fluids.  If you have an appetite, eat a normal diet unless your caregiver tells you differently.  Eat a variety of complex carbohydrates (rice, wheat, potatoes, bread), lean meats, yogurt, fruits, and vegetables.  Avoid high-fat foods because they are more difficult to digest.  Drink enough water and fluids to keep your urine clear or pale yellow.  If you are dehydrated, ask your caregiver for specific rehydration instructions. Signs of dehydration may include:  Severe thirst.  Dry lips and mouth.  Dizziness.  Dark urine.  Decreasing urine frequency and amount.  Confusion.  Rapid breathing or pulse. SEEK IMMEDIATE MEDICAL CARE IF:   You have blood or brown flecks (like coffee grounds) in your vomit.  You have black or bloody stools.  You have a severe headache or stiff neck.  You are confused.  You have severe abdominal pain.  You have chest pain or trouble breathing.  You do not urinate at least once every 8 hours.  You develop cold or clammy skin.  You continue to vomit for longer than 24 to 48 hours.  You have a fever. MAKE SURE YOU:  Understand these instructions.  Will watch your condition.  Will get help right away if you are not doing well or get worse.   This information is not intended to replace advice  given to you by your health care provider. Make sure you discuss any questions you have with your health care provider.   Document Released: 10/01/2005 Document Revised: 12/24/2011 Document Reviewed: 02/28/2011 [employer] Interactive Patient Education [employer]

## 2015-11-29 NOTE — ED Provider Notes (Signed)
[facility] Emergency Department Provider Note   ____________________________________________  Time seen: Approximately 8:30 PM I have reviewed the triage vital signs and the triage nursing note.  HISTORY  Chief Complaint Emesis   Historian Patient and daughter  HPI Steven Roach is a 82 y.o. male with a history of Parkinson's and GERD as well as history of CABG, who has had vomiting for 3 days now. It's been nonbloody and nonbilious. He has had some intermittent abdominal pains which he points down in the lower abdomen. Currently he has no abdominal pain. He still feels some bloating. He's not had a fever. His primary care physician Dr. Burnadette Pop had apparently called in Zofran which was providing some relief, and then he started vomiting again which is why they brought him in for evaluation. No diarrhea. No known sick contacts. No coughing or trouble breathing.Today he's had several episodes of emesis.    Past Medical History  Diagnosis Date  . Parkinson's disease (HCC)   . GERD (gastroesophageal reflux disease)     There are no active problems to display for this patient.   Past Surgical History  Procedure Laterality Date  . Cardiac surgery      Current Outpatient Rx  Name  Route  Sig  Dispense  Refill  . amitriptyline (ELAVIL) 150 MG tablet      Take one tablet by mouth every morning   30 tablet   3   . aspirin 81 MG tablet   Oral   Take 81 mg by mouth daily.         . carbidopa-levodopa (SINEMET IR) 25-100 MG per tablet      TAKE ONE (1) TABLET THREE (3) TIMES EACH DAY         . finasteride (PROSCAR) 5 MG tablet   Oral   Take by mouth.         . lansoprazole (PREVACID) 30 MG capsule   Oral   Take by mouth.         . pravastatin (PRAVACHOL) 40 MG tablet   Oral   Take by mouth.         . sertraline (ZOLOFT) 50 MG tablet      TAKE ONE (1) TABLET EACH DAY         . tamsulosin (FLOMAX) 0.4 MG CAPS capsule  Oral   Take by mouth.           Allergies Review of patient's allergies indicates no known allergies.  History reviewed. No pertinent family history.  Social History Social History  Substance Use Topics  . Smoking status: Former Games developer  . Smokeless tobacco: None  . Alcohol Use: No    Review of Systems  Constitutional: Negative for fever. Eyes: Negative for visual changes. ENT: Negative for sore throat. Cardiovascular: Negative for chest pain. Respiratory: Negative for shortness of breath. Gastrointestinal: Negative for diarrhea. Genitourinary: Negative for dysuria. Musculoskeletal: Negative for back pain. Skin: Negative for rash.  Neurological: Negative for headache. 10 point Review of Systems otherwise negative ____________________________________________   PHYSICAL EXAM:  VITAL SIGNS: ED Triage Vitals  Enc Vitals Group     BP 11/29/15 1656 147/69 mmHg     Pulse Rate 11/29/15 1656 79     Resp 11/29/15 1656 20     Temp 11/29/15 1656 98.3 F (36.8 C)     Temp Source 11/29/15 1656 Oral     SpO2 11/29/15 1656 95 %     Weight 11/29/15 1656 165 lb (  74.844 kg)     Height 11/29/15 1656 5\' 9"  (1.753 m)     Head Cir --      Peak Flow --      Pain Score 11/29/15 1657 6     Pain Loc --      Pain Edu? --      Excl. in GC? --      Constitutional: Alert and oriented. Well appearing and in no distress. HEENT   Head: Normocephalic and atraumatic.      Eyes: Conjunctivae are normal. PERRL. Normal extraocular movements.      Ears:         Nose: No congestion/rhinnorhea.   Mouth/Throat: Mucous membranes are mildly dry.   Neck: No stridor. Cardiovascular/Chest: Normal rate, regular rhythm.  No murmurs, rubs, or gallops. Respiratory: Normal respiratory effort without tachypnea nor retractions. Breath sounds are clear and equal bilaterally. No wheezes/rales/rhonchi. Gastrointestinal: Soft. Very mild diffuse tenderness. Somewhat distended. No guarding or  rebound.  Genitourinary/rectal:Deferred Musculoskeletal: Nontender with normal range of motion in all extremities. No joint effusions.  No lower extremity tenderness.  No edema. Neurologic:  Normal speech and language. No gross or focal neurologic deficits are appreciated. Skin:  Skin is warm, dry and intact. No rash noted. Psychiatric: Mood and affect are normal. Speech and behavior are normal. Patient exhibits appropriate insight and judgment.  ____________________________________________   EKG I, Governor Rooks, MD, the attending physician have personally viewed and interpreted all ECGs.  85 bpm. Sinus rhythm with PVC and PAC. Narrow QRS. Normal axis. Nonspecific T-wave ____________________________________________  LABS (pertinent positives/negatives)  Comprehensive metabolic panel without significant abnormality including LFTs within normal limits Lipase 24 Troponin less than 0.03 Urinalysis rare bacteria otherwise negative. Also Trace ketones White blood cell 8.9, hemoglobin 15.3 platelet count 228  ____________________________________________  RADIOLOGY All Xrays were viewed by me. Imaging interpreted by Radiologist.  Chest:  IMPRESSION: No acute disease.  Compression fracture the thoracolumbar junction is new since the prior studies but age indeterminate.  CT abdomen and pelvis with contrast:  IMPRESSION: No acute intra-abdominal or pelvic pathology.  Diverticulosis. No evidence of bowel obstruction or inflammation. Normal appendix.  Age indeterminate T12 and L1 compression deformity. Clinical correlation is recommended. __________________________________________  PROCEDURES  Procedure(s) performed: None  Critical Care performed: None  ____________________________________________   ED COURSE / ASSESSMENT AND PLAN  Pertinent labs & imaging results that were available during my care of the patient were reviewed by me and considered in my medical decision  making (see chart for details).   Patient is here with vomiting for 3 days, and overall he is well-appearing with stable vital signs. His white blood count is normal, his urinalysis shows trace ketones, and his electrolytes and kidney function are stable and reassuring.  Given his mild abdominal discomfort and the persistence of vomiting and without any diarrhea, I did discuss with the patient and family obtaining CT scan to rule out intra-abdominal emergency such as obstruction.  ----------------------------------------- 11:39 PM on 11/29/2015 -----------------------------------------  Negative CT of the abdomen. I discussed with family that I do still suspect this is viral etiology. Patient is okay for discharge home.    CONSULTATIONS:   None   Patient / Family / Caregiver informed of clinical course, medical decision-making process, and agree with plan.   I discussed return precautions, follow-up instructions, and discharged instructions with patient and/or family.   ___________________________________________   FINAL CLINICAL IMPRESSION(S) / ED DIAGNOSES   Final diagnoses:  Non-intractable vomiting with nausea,  vomiting of unspecified type              Note: This dictation was prepared with Dragon dictation. Any transcriptional errors that result from this process are unintentional   Governor Rooks, MD 11/29/15 2340

## 2016-02-27 DIAGNOSIS — F411 Generalized anxiety disorder: Secondary | ICD-10-CM | POA: Insufficient documentation

## 2017-01-21 ENCOUNTER — Telehealth: Payer: Self-pay | Admitting: Family Medicine

## 2017-01-21 NOTE — Telephone Encounter (Signed)
ok 

## 2017-01-21 NOTE — Telephone Encounter (Signed)
See note ED

## 2017-01-21 NOTE — Telephone Encounter (Signed)
Pt daughter Steven Roach states she is a pt of Dr Sullivan Lone and would like for her father, Steven Roach to see Dr Sullivan Lone as a new patient.  Pt is a pt at [facility].  Rx-carbidopa-levodopa (SINEMET IR) 25-100 MG per tablet, lansoprazole (PREVACID) 30 MG capsule, tamsulosin (FLOMAX) 0.4 MG CAPS capsule, sertraline (ZOLOFT) 50 MG table, finasteride (PROSCAR) 5 MG tablet, pravastatin (PRAVACHOL) 40 MG tablet and aspirin 81 MG tablet.  Pt also take a vitamin B12 and a stool softner every other day.  Pt has insurance with Medicare and mutual of [place].  Pt is at [facility].  CB#[phone]

## 2017-01-21 NOTE — Telephone Encounter (Signed)
Please review-aa 

## 2017-01-23 NOTE — Telephone Encounter (Signed)
Pt new patient appointment is 03/06/2017@2 /MW

## 2017-03-06 ENCOUNTER — Ambulatory Visit (INDEPENDENT_AMBULATORY_CARE_PROVIDER_SITE_OTHER): Payer: Medicare Other | Admitting: Family Medicine

## 2017-03-06 ENCOUNTER — Encounter: Payer: Self-pay | Admitting: Family Medicine

## 2017-03-06 ENCOUNTER — Other Ambulatory Visit: Payer: Self-pay

## 2017-03-06 VITALS — BP 100/52 | HR 84 | Temp 97.5°F | Resp 18 | Wt 150.0 lb

## 2017-03-06 DIAGNOSIS — F411 Generalized anxiety disorder: Secondary | ICD-10-CM

## 2017-03-06 DIAGNOSIS — N529 Male erectile dysfunction, unspecified: Secondary | ICD-10-CM | POA: Insufficient documentation

## 2017-03-06 DIAGNOSIS — Z7689 Persons encountering health services in other specified circumstances: Secondary | ICD-10-CM

## 2017-03-06 DIAGNOSIS — E7849 Other hyperlipidemia: Secondary | ICD-10-CM

## 2017-03-06 DIAGNOSIS — F3289 Other specified depressive episodes: Secondary | ICD-10-CM | POA: Diagnosis not present

## 2017-03-06 DIAGNOSIS — F329 Major depressive disorder, single episode, unspecified: Secondary | ICD-10-CM | POA: Insufficient documentation

## 2017-03-06 DIAGNOSIS — E784 Other hyperlipidemia: Secondary | ICD-10-CM | POA: Diagnosis not present

## 2017-03-06 DIAGNOSIS — Z8619 Personal history of other infectious and parasitic diseases: Secondary | ICD-10-CM | POA: Insufficient documentation

## 2017-03-06 DIAGNOSIS — R7303 Prediabetes: Secondary | ICD-10-CM | POA: Diagnosis not present

## 2017-03-06 DIAGNOSIS — G20 Parkinson's disease: Secondary | ICD-10-CM

## 2017-03-06 DIAGNOSIS — L89152 Pressure ulcer of sacral region, stage 2: Secondary | ICD-10-CM | POA: Diagnosis not present

## 2017-03-06 DIAGNOSIS — E785 Hyperlipidemia, unspecified: Secondary | ICD-10-CM | POA: Insufficient documentation

## 2017-03-06 DIAGNOSIS — I10 Essential (primary) hypertension: Secondary | ICD-10-CM | POA: Insufficient documentation

## 2017-03-06 DIAGNOSIS — N281 Cyst of kidney, acquired: Secondary | ICD-10-CM | POA: Insufficient documentation

## 2017-03-06 DIAGNOSIS — K219 Gastro-esophageal reflux disease without esophagitis: Secondary | ICD-10-CM | POA: Diagnosis not present

## 2017-03-06 DIAGNOSIS — K259 Gastric ulcer, unspecified as acute or chronic, without hemorrhage or perforation: Secondary | ICD-10-CM | POA: Insufficient documentation

## 2017-03-06 NOTE — Progress Notes (Signed)
[name]  MRN: [id number] DOB: [birth year]  Subjective:  HPI  Patient is here to get established. Patient was seen Dr. Burnadette PopLinthavong at [facility]. Patient has a bed sore on his bottom, switched cream 2 weeks ago and it is looking better and would like to have this looked at. Patient also fell in march and broke right wrist-had a small fracture. Patient has had follow up and area is healing well. Also daughter [facility]. [facility]. Patient is getting physical and [facility] done [facility]. Patient is depressed since his wife passed away. He is usually sleeping well but the past few nights has not been. Depression screen PHQ 2/9 03/06/2017  Decreased Interest 3  Down, Depressed, Hopeless 3  PHQ - 2 Score 6  Altered sleeping 2  Tired, decreased energy 2  Change in appetite 1  Feeling bad or failure about yourself  3  Trouble concentrating 3  Moving slowly or fidgety/restless 3  Suicidal thoughts 3  PHQ-9 Score 23   Patient Active Problem List   Diagnosis Date Noted  . Borderline diabetes 03/06/2017  . GERD without esophagitis 03/06/2017  . Hyperlipidemia 03/06/2017  . Stomach ulcer 03/06/2017  . Depression 03/06/2017  . History of chickenpox 03/06/2017  . History of measles 03/06/2017  . History of mumps 03/06/2017  . Bilateral renal cysts 03/06/2017  . Impotence 03/06/2017  . Hypertension 03/06/2017  . GAD (generalized anxiety disorder) 02/27/2016  . CAD (coronary artery disease) 05/07/2012  . PD (Parkinson's disease) (HCC) 05/07/2012  . S/P CABG (coronary artery bypass graft) 05/07/2012    Past Medical History:  Diagnosis Date  . GERD (gastroesophageal reflux disease)   . Parkinson's disease [facility](HCC)     Social History   Social History  . Marital status: Married    Spouse name: N/A  . Number of children: N/A  . Years of education: N/A   Occupational History  . Not  on file.   Social History Main Topics  . Smoking status: Former Smoker    Packs/day: 1.00    Years: 15.00    Types: Cigarettes    Quit date: 10/16/1963  . Smokeless tobacco: Never Used  . Alcohol use No  . Drug use: No  . Sexual activity: No   Other Topics Concern  . Not on file   Social History Narrative  . No narrative on file    Outpatient Encounter Prescriptions as of 03/06/2017  Medication Sig Note  . acetaminophen (TYLENOL) 500 MG tablet Take 500 mg by mouth every 6 (six) hours as needed.   Marland Kitchen. aspirin 81 MG tablet Take 81 mg by mouth daily.   . carbidopa-levodopa (SINEMET IR) 25-100 MG per tablet TAKE ONE (1.5) TABLET THREE (3) TIMES EACH DAY 04/12/2014: [facility]  . dextromethorphan-guaiFENesin (MUCINEX DM) 30-600 MG 12hr tablet Take 1 tablet by mouth 2 (two) times daily as needed for cough.   . docusate sodium (COLACE) 100 MG capsule Take 100 mg by mouth every other day.   . finasteride (PROSCAR) 5 MG tablet Take 5 mg by mouth daily.  04/12/2014: [facility]  . lansoprazole (PREVACID) 30 MG capsule Take 30 mg by mouth daily at 12 noon.  04/12/2014: [facility]  . ondansetron (ZOFRAN) 4 MG tablet Take 4 mg by mouth every 8 (eight) hours as needed for nausea or vomiting.   .Marland Kitchen  pravastatin (PRAVACHOL) 40 MG tablet Take 40 mg by mouth daily.  04/12/2014: Received from: [facility]  . sertraline (ZOLOFT) 50 MG tablet TAKE ONE (2) TABLET EACH DAY 04/12/2014: Received from: [facility]  . Skin Protectants, Misc. (ENDIT EX) Apply topically. Apply cream 3 times daily.   . tamsulosin (FLOMAX) 0.4 MG CAPS capsule Take 0.4 mg by mouth daily.  04/12/2014: Received from: [facility]  . vitamin B-12 (CYANOCOBALAMIN) 1000 MCG tablet Take 1,000 mcg by mouth daily.   . [DISCONTINUED] amitriptyline (ELAVIL) 150 MG tablet Take one tablet by mouth every  morning    No facility-administered encounter medications on file as of 03/06/2017.     Allergies  Allergen Reactions  . Celecoxib Other (See Comments)    Other Reaction: GI Upset, gastritis    Review of Systems  Constitutional: Positive for malaise/fatigue.       Appetite change  HENT: Positive for hearing loss.        Drooling, voice change, runny nose, sneezing  Eyes:       Eye itching  Gastrointestinal: Positive for constipation.  Genitourinary: Positive for frequency.       Incontinence-urinary  Musculoskeletal:       Neck stiffness  Neurological: Positive for dizziness, weakness and headaches.       Shuffles his feet with walking  Psychiatric/Behavioral:       Agitation, decreased concentration.    Objective:  BP (!) 100/52   Pulse 84   Temp 97.5 F (36.4 C)   Resp 18   Wt 150 lb (68 kg)   BMI 22.15 kg/m   Physical Exam  Constitutional: He is oriented to person, place, and time and well-developed, well-nourished, and in no distress.  HENT:  Head: Normocephalic and atraumatic.  Eyes: Conjunctivae are normal. Pupils are equal, round, and reactive to light.  Neck: Normal range of motion. Neck supple.  Cardiovascular: Normal rate, regular rhythm, normal heart sounds and intact distal pulses.  Exam reveals no gallop.   No murmur heard. Pulmonary/Chest: Effort normal and breath sounds normal. No respiratory distress. He has no wheezes.  Neurological: He is alert and oriented to person, place, and time. Gait abnormal.  Shuffles his feet with walking, uses walker to ambulate.  Skin:  Stage 2 sacral decubitus sore-healing   Assessment and Plan 1. Encounter to establish care with new doctor  2. PD (Parkinson's disease) [facility]) Per daughters request refer to [facility] neurology with movement disorder specialty doctor. - Ambulatory referral to Neurology - CBC with Differential/Platelet  3. GERD without esophagitis 4. GAD (generalized anxiety disorder) - CBC with  Differential/Platelet  5. Other depression Will follow for now. Patient is mourning the death of his wife. Will re check on the next visit. - CBC with Differential/Platelet - TSH  6. Other hyperlipidemia - Comprehensive metabolic panel - Lipid Panel With LDL/HDL Ratio  7. Borderline diabetes - HgB A1c  8. Decubitus ulcer of sacral region, stage 2 Healing. Will follow. Continue using cream and gel pillow.  HPI, Exam and A&P transcribed by Samara Deist, RMA under direction and in the presence of Julieanne Manson, MD. I have done the exam and reviewed the chart and it is accurate to the best of my knowledge. Dentist has been used and  any errors in dictation or transcription are unintentional. Julieanne Manson M.D. [facility]

## 2017-03-07 LAB — COMPREHENSIVE METABOLIC PANEL
ALK PHOS: 95 IU/L (ref 39–117)
ALT: 6 IU/L (ref 0–44)
AST: 14 IU/L (ref 0–40)
Albumin/Globulin Ratio: 1.4 (ref 1.2–2.2)
Albumin: 4.3 g/dL (ref 3.5–4.7)
BUN/Creatinine Ratio: 15 (ref 10–24)
BUN: 15 mg/dL (ref 8–27)
Bilirubin Total: 0.9 mg/dL (ref 0.0–1.2)
CALCIUM: 9.2 mg/dL (ref 8.6–10.2)
CO2: 25 mmol/L (ref 18–29)
CREATININE: 1.01 mg/dL (ref 0.76–1.27)
Chloride: 101 mmol/L (ref 96–106)
GFR calc Af Amer: 78 mL/min/{1.73_m2} (ref >59)
GFR, EST NON AFRICAN AMERICAN: 68 mL/min/{1.73_m2} (ref >59)
GLOBULIN, TOTAL: 3.1 g/dL (ref 1.5–4.5)
GLUCOSE: 104 mg/dL — AB (ref 65–99)
Potassium: 4.3 mmol/L (ref 3.5–5.2)
SODIUM: 142 mmol/L (ref 134–144)
Total Protein: 7.4 g/dL (ref 6.0–8.5)

## 2017-03-07 LAB — CBC WITH DIFFERENTIAL/PLATELET
BASOS ABS: 0 10*3/uL (ref 0.0–0.2)
Basos: 0 %
EOS (ABSOLUTE): 0.2 10*3/uL (ref 0.0–0.4)
Eos: 2 %
HEMATOCRIT: 44 % (ref 37.5–51.0)
HEMOGLOBIN: 14.6 g/dL (ref 13.0–17.7)
IMMATURE GRANULOCYTES: 0 %
Immature Grans (Abs): 0 10*3/uL (ref 0.0–0.1)
Lymphocytes Absolute: 2.3 10*3/uL (ref 0.7–3.1)
Lymphs: 26 %
MCH: 30 pg (ref 26.6–33.0)
MCHC: 33.2 g/dL (ref 31.5–35.7)
MCV: 90 fL (ref 79–97)
MONOS ABS: 0.8 10*3/uL (ref 0.1–0.9)
Monocytes: 9 %
NEUTROS PCT: 63 %
Neutrophils Absolute: 5.6 10*3/uL (ref 1.4–7.0)
Platelets: 235 10*3/uL (ref 150–379)
RBC: 4.87 x10E6/uL (ref 4.14–5.80)
RDW: 14.2 % (ref 12.3–15.4)
WBC: 9 10*3/uL (ref 3.4–10.8)

## 2017-03-07 LAB — LIPID PANEL WITH LDL/HDL RATIO
CHOLESTEROL TOTAL: 152 mg/dL (ref 100–199)
HDL: 35 mg/dL — ABNORMAL LOW (ref >39)
LDL CALC: 91 mg/dL (ref 0–99)
LDL/HDL RATIO: 2.6 ratio (ref 0.0–3.6)
TRIGLYCERIDES: 131 mg/dL (ref 0–149)
VLDL CHOLESTEROL CAL: 26 mg/dL (ref 5–40)

## 2017-03-07 LAB — HEMOGLOBIN A1C
Est. average glucose Bld gHb Est-mCnc: 126 mg/dL
Hgb A1c MFr Bld: 6 % — ABNORMAL HIGH (ref 4.8–5.6)

## 2017-03-07 LAB — TSH: TSH: 1.19 u[IU]/mL (ref 0.450–4.500)

## 2017-04-08 ENCOUNTER — Telehealth: Payer: Self-pay

## 2017-04-08 NOTE — Telephone Encounter (Signed)
Is this ok?-aa 

## 2017-04-08 NOTE — Telephone Encounter (Signed)
Kat from [facility] reports that the family of the patient told her that he is to hold the Vitamin B12 for now. She is requesting an order for this to be sent to them. Thanks!

## 2017-04-09 NOTE — Telephone Encounter (Signed)
Will fax order for below shortly-aa

## 2017-04-09 NOTE — Telephone Encounter (Signed)
ok 

## 2017-04-23 ENCOUNTER — Ambulatory Visit (INDEPENDENT_AMBULATORY_CARE_PROVIDER_SITE_OTHER): Payer: Medicare Other | Admitting: Neurology

## 2017-04-23 ENCOUNTER — Encounter: Payer: Self-pay | Admitting: Neurology

## 2017-04-23 VITALS — BP 112/65 | HR 104 | Ht 69.0 in | Wt 150.0 lb

## 2017-04-23 DIAGNOSIS — R443 Hallucinations, unspecified: Secondary | ICD-10-CM

## 2017-04-23 DIAGNOSIS — N39498 Other specified urinary incontinence: Secondary | ICD-10-CM

## 2017-04-23 DIAGNOSIS — G20 Parkinson's disease: Secondary | ICD-10-CM | POA: Diagnosis not present

## 2017-04-23 MED ORDER — CARBIDOPA-LEVODOPA 25-100 MG PO TABS: 1.5000 | ORAL_TABLET | Freq: Three times a day (TID) | ORAL | 3 refills | Status: DC

## 2017-04-23 NOTE — Patient Instructions (Addendum)
I recommend you continue with your current medications.   Please be really proactive with your constipation medication regimen, titrating as needed to where you have a formed stool at least every other day.   You need to drink more water.

## 2017-04-23 NOTE — Progress Notes (Signed)
Subjective:    Patient ID: FARRIS GEIMAN is a 83 y.o. male.  HPI     Huston Foley, MD, PhD [facility] [address] [address] [place], [place] [address]  Dear Dr. Sullivan Lone,   I saw your patient, Daelan Gatt, upon your kind request in my neurologic clinic today for initial consultation of his diagnosis of Parkinson's disease. The patient is accompanied by his daughter and daughter-in-law today. As you know, Mr. Caudillo is an 83 year old right-handed gentleman with an underlying medical history of reflux disease, reflux disease, history of lacunar stroke, peptic ulcer disease, hyperlipidemia, borderline diabetes, depression, hypertension, anxiety, coronary artery disease with status post CABG, and remote history of smoking, who reports a diagnosis of Parkinson's disease. His history is primarily supplied by his daughter and also his daughter-in-law. He was diagnosed in 2010. Symptoms started with balance issues, gait difficulty, global weakness, fatigue, stiffness, and decrease in arm swing, probably on the left. He has had recurrent falls. He uses a rolling walker. He cannot sleep in the bed and has slept in his recliner for the past 3 years or so. He has had some intermittent visual hallucinations, probably worse since his wife's passing. Sadly, he recently lost his wife in March 2018. She also had Parkinson's disease as I understand and had recurrent falls. They moved into assisted living in May 2017. Prior to that about 3 years ago they moved into independent living but because of her recurrent falls they could not continue to be an independent living. He has been on Zoloft. Of note, he previously followed with Dr. Jerold Coombe at [facility], last seen on 06/05/2016, I reviewed the note. He also saw Dr. Ardyth Man in the past, neurologist at [facility] His been on Sinemet. He currently takes 1-1/2 pills 3 times a day. He has a prior history of oral facial  dyskinesias as per chart review. He has also been seen by the [place]. in fact, his Sinemet prescription is usually through the [place] interim. He needs a new prescription per daughter. Of note, he had a brain MRI with and without contrast on 04/15/2012 which I reviewed: IMPRESSION:   1.  Chronic and involutional changes.  2.  Area of subacute to chronic lacunar infarction within the pons on the  right.  3.  No evidence of acute abnormalities.  He lives in assisted living in [place]. He has had issues recently with a decubitus sore. He quit smoking over 30 years ago. He does not currently drink alcohol, he doesn't currently drink caffeine on a daily basis. He has 2 children, one son, one daughter, 3 granddaughters, 4 great-grandchildren. He does not drink enough water, maybe one cup per day. He has had some urinary incontinence and daughter-in-law requested we check his urinalysis but he was not able to go.  His Past Medical History Is Significant For: Past Medical History:  Diagnosis Date  . GERD (gastroesophageal reflux disease)   . Parkinson's disease (HCC)     His Past Surgical History Is Significant For: Past Surgical History:  Procedure Laterality Date  . CARDIAC SURGERY     bypass-at [facility]  . CATARACT EXTRACTION, BILATERAL      His Family History Is Significant For: Family History  Problem Relation Age of Onset  . Diabetes Mother   . Breast cancer Mother   . Heart attack Sister   . Diabetes Brother     His Social History Is Significant For: Social History   Social History  .  Marital status: Married    Spouse name: N/A  . Number of children: N/A  . Years of education: N/A   Social History Main Topics  . Smoking status: Former Smoker    Packs/day: 1.00    Years: 15.00    Types: Cigarettes    Quit date: 10/16/1963  . Smokeless tobacco: Never Used  . Alcohol use No  . Drug use: No  . Sexual activity: No   Other Topics Concern  . None   Social History Narrative  .  None    His Allergies Are:  Allergies  Allergen Reactions  . Celecoxib Other (See Comments)    Other Reaction: GI Upset, gastritis  :   His Current Medications Are:  Outpatient Encounter Prescriptions as of 04/23/2017  Medication Sig  . acetaminophen (TYLENOL) 500 MG tablet Take 500 mg by mouth every 6 (six) hours as needed.  Marland Kitchen aspirin 81 MG tablet Take 81 mg by mouth daily.  . carbidopa-levodopa (SINEMET IR) 25-100 MG tablet Take 1.5 tablets by mouth 3 (three) times daily. TAKE ONE (1.5) TABLET THREE (3) TIMES EACH DAY To be faxed to [place] in [place].  . dextromethorphan-guaiFENesin (MUCINEX DM) 30-600 MG 12hr tablet Take 1 tablet by mouth 2 (two) times daily as needed for cough.  . docusate sodium (COLACE) 100 MG capsule Take 100 mg by mouth every other day.  . finasteride (PROSCAR) 5 MG tablet Take 5 mg by mouth daily.   . lansoprazole (PREVACID) 30 MG capsule Take 30 mg by mouth daily at 12 noon.   . ondansetron (ZOFRAN) 4 MG tablet Take 4 mg by mouth every 8 (eight) hours as needed for nausea or vomiting.  . sertraline (ZOLOFT) 50 MG tablet TAKE ONE (2) TABLET EACH DAY  . Skin Protectants, Misc. (ENDIT EX) Apply topically. Apply cream 3 times daily.  . tamsulosin (FLOMAX) 0.4 MG CAPS capsule Take 0.4 mg by mouth daily.   . [DISCONTINUED] carbidopa-levodopa (SINEMET IR) 25-100 MG per tablet TAKE ONE (1.5) TABLET THREE (3) TIMES EACH DAY  . [DISCONTINUED] pravastatin (PRAVACHOL) 40 MG tablet Take 40 mg by mouth daily.   . [DISCONTINUED] vitamin B-12 (CYANOCOBALAMIN) 1000 MCG tablet Take 1,000 mcg by mouth daily.   No facility-administered encounter medications on file as of 04/23/2017.   : Review of Systems:  Out of a complete 14 point review of systems, all are reviewed and negative with the exception of these symptoms as listed below:  Review of Systems  Neurological:       Pt presents today as a transfer of care from [facility] for his PD. Pt has had a couple falls recently; one fall  resulted in a stress fracture of his wrist. Pt is recently widowed.    Objective:  Neurological Exam  Physical Exam Physical Examination:   Vitals:   04/23/17 1429  BP: 112/65  Pulse: (!) 104    General Examination: The patient is a very pleasant 83 y.o. male in no acute distress.He is frail appearing.  HEENT: Normocephalic, atraumatic, pupils are equal, round and reactive to light and accommodation. Pupils are very small, funduscopic exam is not possible. Extraocular tracking shows moderate to severe saccadic breakdown. No nystagmus. He has mild oral cold contractions upon motor testing. No telltale dyskinesias noted in his upper body or head and neck area, neck is moderately to severely rigid. He has decreased range of motion upon passive mobility as well. Cardiovascular exam in general exam are nonfocal. He has no significant lower extremity  swelling. Speech is significantly hypophonic, mild dysarthria noted, he is hearing impaired, bilateral hearing aids in place.  Neurologically:  Mental status: The patient is awake and alert, paying good  attention. His short-term memory is mildly impaired. There is some delay in responding. Of note he is also hearing impaired. He is not able to provide his own history, his daughter provides most of his history, supplemented by his daughter-in-law. Cranial nerves are as described above under HEENT exam. In addition, shoulder shrug is normal with equal shoulder height noted.   Motor exam: Thin bulk, global strength of 4 out of 5 is noted. Tone is increased with mild cogwheeling noted in the right upper extremity, no telltale resting tremor, motor exam with fine motor skills shows moderate to severe impairment for hand movements, finger taps, foot taps, rapid alternating patting, slight lateralization to the left. He has significant cocontraction is noted. Mild intermittent lower body and foot dyskinesias and noted. He stands with significant difficulty  and requires some assistance. His posture is moderately stooped and he has a tendency to fall backwards, balance is impaired. He is using his rolling walker with difficulty with some start hesitation noted, no freezing spells otherwise, he turns with difficulty. Sensory exam is intact to light touch, reflexes are 1+ globally.   Assessment and Plan:    In summary, Real ConsFrank S Bracey is a very pleasant 83 y.o.-year old male with an underlying medical history of reflux disease, reflux disease, history of lacunar stroke, peptic ulcer disease, hyperlipidemia, borderline diabetes, depression, hypertension, anxiety, coronary artery disease with status post CABG, and remote history of smoking, who presents for initial evaluation and ongoing management of his prior diagnosis of Parkinson's disease. His history and physical exam are in keeping with akinetic-rigid type Parkinson's disease, with some lateralization noted to the left. He's had recurrent falls, he has impaired balance, mild dyskinesias and noted, some history of mild intermittent visual hallucinations, mild memory loss. I suggested we continue with his current regimen of Sinemet 1-1/2 pills 3 times a day and I provided a renewed prescription for this which we will fax as per [place] [facility] [place] [facility]. Had a long discussion with the patient and particularly with his daughter and daughter-in-law about Parkinson's disease, its progressions, medication options and complicating factors. Thankfully, he is established in assisted living, we will reinitiate in-house PT, OT and speech therapy. They are advised to encourage oral intake particularly fluid intake and also be proactive about constipation issues. He seems to have a very caring family which is reassuring to see. We mutually agreed to have him come back in about 6 months, sooner as needed. His daughter is encouraged to sign about 4 my chart and try to communicate via email conversation as well.  I  answered all their questions today and the patient and his family were in agreement. Thank you very much for allowing me to participate in the care of this nice patient. If I can be of any further assistance to you please do not hesitate to call me at [phone].  Sincerely,   Huston FoleySaima Caya Soberanis, MD, PhD

## 2017-04-24 ENCOUNTER — Telehealth: Payer: Self-pay | Admitting: Neurology

## 2017-04-24 NOTE — Telephone Encounter (Signed)
Patients daughter Windell MouldingRuth called office in reference to patient being seen yesterday.  Orders from yesterday's visit she is unable to find and would like to know if new orders can be [facility] [place], [place] Fax #[phone].  Windell MouldingRuth would like to make sure on the orders if Dr. Frances FurbishAthar would put patient is needing to go to [facility] to have urine sample done.  Please call

## 2017-04-24 NOTE — Telephone Encounter (Signed)
Returned Virgel ManifoldRuth Anne Lanz (daughter in law)'s call, per [facility]. I advised her that the urinalysis order was sent to [facility], per request. Virgel Manifolduth Anne verbalized understanding and appreciation.

## 2017-05-02 ENCOUNTER — Telehealth: Payer: Self-pay | Admitting: Neurology

## 2017-05-02 NOTE — Telephone Encounter (Signed)
I called Steven Roach back, informed her that the RX for sinemet has been faxed to [facility]. Pt's daughter verbalized understanding and appreciation.

## 2017-05-02 NOTE — Telephone Encounter (Signed)
Notes from 04/23/17 from Dr. Frances FurbishAthar and RX for sinemet sent to Dr. Hassel NethEmbree per pt's daughter's request. Received a receipt of confirmation.

## 2017-05-02 NOTE — Telephone Encounter (Signed)
I called pt's daughter Arline AspCindy, per DPR, and advised her that the notes and order for sinemet were [facility]. Pt's daughter Arline AspCindy voiced understanding and appreciation.

## 2017-05-02 NOTE — Telephone Encounter (Signed)
Pt daughter Cindy(on DPR) called and is asking that the prescription for the carbidopa-levodopa (SINEMET IR) 25-100 MG tablet be faxed to Dr Cherrie [facility]Embree[facility] also Dr Hassel NethEmbree is requesting the notes from the visit on 04-29-2017 .  Pt daughter asking this information be faxed to [phone]

## 2017-05-02 NOTE — Telephone Encounter (Signed)
Pt daughter Arline AspCindy calling back to inform [place] [facility] [place] [facility].  Arline AspCindy is asking if you can send it to  [facility] [place] [place] - [address] [phone] (Phone) [phone] (Fax)   Please call

## 2017-05-03 ENCOUNTER — Other Ambulatory Visit: Payer: Self-pay | Admitting: Family Medicine

## 2017-05-03 NOTE — Telephone Encounter (Signed)
[facility] faxed a request for the following medication. Thanks CC  Skin Protectants, Misc. (ENDIT EX) >Apply to the affected areas 3 times a day.

## 2017-05-06 MED ORDER — ZINC OXIDE 12 % EX CREA: TOPICAL_CREAM | CUTANEOUS | 5 refills | Status: DC

## 2017-05-09 ENCOUNTER — Other Ambulatory Visit: Payer: Self-pay | Admitting: Physician Assistant

## 2017-05-09 DIAGNOSIS — R300 Dysuria: Secondary | ICD-10-CM

## 2017-05-09 NOTE — Progress Notes (Signed)
Urinalysis ordered and lab req printed.

## 2017-05-09 NOTE — Progress Notes (Signed)
Order faxed-aa 

## 2017-05-13 ENCOUNTER — Other Ambulatory Visit: Payer: Self-pay | Admitting: Physician Assistant

## 2017-05-13 DIAGNOSIS — R8290 Unspecified abnormal findings in urine: Secondary | ICD-10-CM

## 2017-05-14 LAB — URINALYSIS, ROUTINE W REFLEX MICROSCOPIC
BILIRUBIN UA: NEGATIVE
GLUCOSE, UA: NEGATIVE
KETONES UA: NEGATIVE
LEUKOCYTES UA: NEGATIVE
Nitrite, UA: NEGATIVE
RBC UA: NEGATIVE
SPEC GRAV UA: 1.022 (ref 1.005–1.030)
UUROB: 1 mg/dL (ref 0.2–1.0)
pH, UA: 6.5 (ref 5.0–7.5)

## 2017-05-21 ENCOUNTER — Other Ambulatory Visit: Payer: Self-pay | Admitting: Family Medicine

## 2017-05-21 NOTE — Telephone Encounter (Signed)
Refill called in to [facility] For Endit Ex 2 jars with 12 refills. Epic does not have this in the system.-aa

## 2017-05-21 NOTE — Telephone Encounter (Signed)
[facility] faxed a request on the following medication. Thanks CC  Skin Protectants, Misc. (ENDIT EX) >Apply to the affected areas 3 times a day.

## 2017-06-04 ENCOUNTER — Other Ambulatory Visit: Payer: Self-pay | Admitting: Family Medicine

## 2017-07-01 ENCOUNTER — Other Ambulatory Visit: Payer: Self-pay | Admitting: Family Medicine

## 2017-07-08 ENCOUNTER — Ambulatory Visit (INDEPENDENT_AMBULATORY_CARE_PROVIDER_SITE_OTHER): Payer: Medicare Other | Admitting: Family Medicine

## 2017-07-08 VITALS — BP 130/60 | HR 90 | Resp 14 | Wt 150.0 lb

## 2017-07-08 DIAGNOSIS — Z23 Encounter for immunization: Secondary | ICD-10-CM

## 2017-07-08 NOTE — Progress Notes (Signed)
Steven Roach  MRN: [id number] DOB: 12-Jun-1929  Subjective:  HPI   The patient is an 83 year old male who presents with his daughter today for 4 month follow up of his depression.  His daughter says that he is not doing well.  He does not have episodes of crying but, down and uninterested in things.    He does not have physical complaints except for numbness of his legs and feel and allergy symptoms.   His daughter states that he sleeps in his chair as it is difficult for him to get up and down.  She said that she will go to see him and he will have his legs up on a pillow.  She would like to have physical therapy ordered to see if it will help. His allergy complaints are chronic and unchanged. He seems a little sad/subdued since the death of his wife.   Patient Active Problem List   Diagnosis Date Noted  . Borderline diabetes 03/06/2017  . GERD without esophagitis 03/06/2017  . Hyperlipidemia 03/06/2017  . Stomach ulcer 03/06/2017  . Depression 03/06/2017  . History of chickenpox 03/06/2017  . History of measles 03/06/2017  . History of mumps 03/06/2017  . Bilateral renal cysts 03/06/2017  . Impotence 03/06/2017  . Hypertension 03/06/2017  . GAD (generalized anxiety disorder) 02/27/2016  . CAD (coronary artery disease) 05/07/2012  . PD (Parkinson's disease) (HCC) 05/07/2012  . S/P CABG (coronary artery bypass graft) 05/07/2012    Past Medical History:  Diagnosis Date  . GERD (gastroesophageal reflux disease)   . Parkinson's disease [facility])     Social History   Social History  . Marital status: Married    Spouse name: N/A  . Number of children: N/A  . Years of education: N/A   Occupational History  . Not on file.   Social History Main Topics  . Smoking status: Former Smoker    Packs/day: 1.00    Years: 15.00    Types: Cigarettes    Quit date: 10/16/1963  . Smokeless tobacco: Never Used  . Alcohol use No  . Drug use: No  . Sexual activity: No   Other  Topics Concern  . Not on file   Social History Narrative  . No narrative on file    Outpatient Encounter Prescriptions as of 07/08/2017  Medication Sig Note  . aspirin 81 MG tablet Take 81 mg by mouth daily.   Marland Kitchen atorvastatin (LIPITOR) 20 MG tablet Take 20 mg by mouth daily.   . carbidopa-levodopa (SINEMET IR) 25-100 MG tablet Take 1.5 tablets by mouth 3 (three) times daily. TAKE ONE (1.5) TABLET THREE (3) TIMES EACH DAY To be faxed to [place] in [place].   . dextromethorphan-guaiFENesin (MUCINEX DM) 30-600 MG 12hr tablet Take 1 tablet by mouth 2 (two) times daily as needed for cough.   . docusate sodium (COLACE) 100 MG capsule Take 100 mg by mouth every other day.   . finasteride (PROSCAR) 5 MG tablet Take 5 mg by mouth daily.  04/12/2014: Received from: [facility]  . lansoprazole (PREVACID) 30 MG capsule TAKE ONE CAPSULE BY MOUTH TWICE A DAY   . ondansetron (ZOFRAN) 4 MG tablet Take 4 mg by mouth every 8 (eight) hours as needed for nausea or vomiting.   . sertraline (ZOLOFT) 100 MG tablet TAKE 1 TABLET BY MOUTH ONCE DAILY FOR DEPRESSION   . tamsulosin (FLOMAX) 0.4 MG CAPS capsule Take 0.4 mg by mouth daily.  04/12/2014: Received  from: [facility]  . acetaminophen (TYLENOL) 500 MG tablet Take 500 mg by mouth every 6 (six) hours as needed.   . [DISCONTINUED] sertraline (ZOLOFT) 50 MG tablet TAKE ONE (2) TABLET EACH DAY 04/12/2014: Received from: [facility]  . [DISCONTINUED] Skin Protectants, Misc. (ENDIT EX) Apply topically. Apply cream 3 times daily.   . [DISCONTINUED] Zinc Oxide (SKIN PROTECTANT) 12 % CREA Apply to affected area 3 times daily    No facility-administered encounter medications on file as of 07/08/2017.     Allergies  Allergen Reactions  . Celecoxib Other (See Comments)    Other Reaction: GI Upset, gastritis    Review of Systems  Constitutional: Negative for fever and malaise/fatigue.  HENT: Negative.   Eyes: Negative.     Respiratory: Negative for cough, sputum production, shortness of breath and wheezing.   Cardiovascular: Negative for chest pain, palpitations, orthopnea, claudication and leg swelling.  Gastrointestinal: Negative.   Skin: Negative.   Neurological: Positive for tremors, sensory change and weakness.  Endo/Heme/Allergies: Negative.   Psychiatric/Behavioral: Negative.     Objective:  BP 130/60 (BP Location: Right Arm, Patient Position: Sitting, Cuff Size: Normal)   Pulse 90   Resp 14   Wt 150 lb (68 kg)   SpO2 99%   BMI 22.15 kg/m   Physical Exam  Constitutional: He is oriented to person, place, and time and well-developed, well-nourished, and in no distress.  HENT:  Head: Normocephalic and atraumatic.  Right Ear: External ear normal.  Left Ear: External ear normal.  Nose: Nose normal.  Eyes: Conjunctivae are normal. No scleral icterus.  Neck: No thyromegaly present.  Cardiovascular: Normal rate, regular rhythm and normal heart sounds.   Pulmonary/Chest: Effort normal and breath sounds normal.  Abdominal: Soft.  Neurological: He is alert and oriented to person, place, and time. GCS score is 15.  Parkinsons stigmata.  Skin: Skin is warm and dry.  Psychiatric: Mood normal.    Assessment and Plan :  1. Need for influenza vaccination  - Flu vaccine HIGH DOSE PF (Fluzone High dose) 2.Parkinsons Disease PT consult for strngth and to lower fall risk. More than 50% of 25 minute visit spen in counseling. 3.CAD/s/p CABG 4Prediabetes 5.Depression Stable all.  I have done the exam and reviewed the chart and it is accurate to the best of my knowledge. Dentist has been used and  any errors in dictation or transcription are unintentional. Julieanne Manson M.D. [facility]

## 2017-09-24 ENCOUNTER — Ambulatory Visit: Payer: Self-pay | Admitting: Family Medicine

## 2017-09-24 ENCOUNTER — Ambulatory Visit: Payer: No Typology Code available for payment source

## 2017-10-17 ENCOUNTER — Telehealth: Payer: Self-pay

## 2017-10-17 ENCOUNTER — Telehealth: Payer: Self-pay | Admitting: Family Medicine

## 2017-10-17 MED ORDER — ALUM HYDROXIDE-MAG CARBONATE 95-358 MG/15ML PO SUSP: 15.0000 mL | Freq: Every evening | ORAL | 5 refills | Status: DC

## 2017-10-17 NOTE — Telephone Encounter (Signed)
Fax from [facility] requesting Gaviscon 1 tbsp (15 ml) once daily each evening for heartburn, #[phone]. Per their fax it says this RX needs a routine renewal since we see patient now. [facility] [place], please Jonelle Sportsadvise-Anastasiya V Hopkins, RMA

## 2017-10-17 NOTE — Telephone Encounter (Signed)
Please advise-Anastasiya V Hopkins, RMA  

## 2017-10-17 NOTE — Telephone Encounter (Signed)
Bed sore on his bottom.  Ointment is not working and wanted to see if there is a patch or something that will help it heal.  Pt is in assisted living facility and state it isn't healing properly.

## 2017-10-17 NOTE — Telephone Encounter (Signed)
rx sent in-Dariella Gillihan V Janyce Ellinger, RMA  

## 2017-10-17 NOTE — Telephone Encounter (Signed)
ok 

## 2017-10-21 ENCOUNTER — Other Ambulatory Visit: Payer: Self-pay | Admitting: Family Medicine

## 2017-10-21 ENCOUNTER — Other Ambulatory Visit: Payer: Self-pay | Admitting: Emergency Medicine

## 2017-10-21 DIAGNOSIS — J069 Acute upper respiratory infection, unspecified: Secondary | ICD-10-CM

## 2017-10-21 MED ORDER — AZITHROMYCIN 250 MG PO TABS: ORAL_TABLET | ORAL | 0 refills | Status: DC

## 2017-10-21 NOTE — Telephone Encounter (Signed)
Daughter, Arline AspCindy, advised. Patient has been sitting on a gel pad for a year at least. She states the last she heard nurse said that sore is worse. Daughter is not if maybe it is due to nurse not putting the ointment on the area as regular as it should be. They said maybe he needs to go back to patches?  Also states that patient has had burning with urination and no other symptoms that daughter knows off but [facility] nurse said she would call us and ask for order on UA. I have not heard or seen anything on this yet. Is it ok to order?-Delailah Spieth [employer] Katarzyna Wolven, RMA

## 2017-10-21 NOTE — Telephone Encounter (Signed)
Better to try eggcrate material if this has not been done,. This is cushion under bottom on bed and chair.

## 2017-10-21 NOTE — Telephone Encounter (Signed)
[facility]cb [facility] Per Dr Sullivan LoneGilbert ok write order for urine culture. Need to ask what patches they would use for patient's sores? How is the sore today?-Anastasiya V Hopkins, RMA  Just spoke to Dr Sullivan LoneGilbert after leaving a message for call back. Per Dr Sullivan LoneGilbert try Tegaderm apply to affected area daily until resolves.   Orders for both faxed to [phone]-Anastasiya Ander PurpuraV Hopkins, RMA

## 2017-10-24 ENCOUNTER — Ambulatory Visit: Payer: Medicare Other | Admitting: Neurology

## 2017-11-06 ENCOUNTER — Ambulatory Visit (INDEPENDENT_AMBULATORY_CARE_PROVIDER_SITE_OTHER): Payer: Medicare Other

## 2017-11-06 ENCOUNTER — Ambulatory Visit (INDEPENDENT_AMBULATORY_CARE_PROVIDER_SITE_OTHER): Payer: Medicare Other | Admitting: Family Medicine

## 2017-11-06 VITALS — BP 128/64 | HR 84 | Wt 145.0 lb

## 2017-11-06 VITALS — BP 128/64 | HR 84 | Ht 69.0 in | Wt 145.4 lb

## 2017-11-06 DIAGNOSIS — I10 Essential (primary) hypertension: Secondary | ICD-10-CM

## 2017-11-06 DIAGNOSIS — G20 Parkinson's disease: Secondary | ICD-10-CM | POA: Diagnosis not present

## 2017-11-06 DIAGNOSIS — R7303 Prediabetes: Secondary | ICD-10-CM

## 2017-11-06 DIAGNOSIS — F3289 Other specified depressive episodes: Secondary | ICD-10-CM | POA: Diagnosis not present

## 2017-11-06 DIAGNOSIS — Z0000 Encounter for general adult medical examination without abnormal findings: Secondary | ICD-10-CM | POA: Diagnosis not present

## 2017-11-06 DIAGNOSIS — E7849 Other hyperlipidemia: Secondary | ICD-10-CM

## 2017-11-06 NOTE — Patient Instructions (Signed)
[name] , Thank you for taking time to come for your Medicare Wellness Visit. I appreciate your ongoing commitment to your health goals. Please review the following plan we discussed and let me know if I can assist you in the future.   Screening recommendations/referrals: Colonoscopy: N/A Recommended yearly ophthalmology/optometry visit for glaucoma screening and checkup Recommended yearly dental visit for hygiene and checkup  Vaccinations: Influenza vaccine: Up to date Pneumococcal vaccine: Up to date Tdap vaccine: Pt declines today.  Shingles vaccine: Pt declines today.     Advanced directives: Please bring a copy of your POA (Power of Attorney) and/or Living Will to your next appointment.   Conditions/risks identified: Fall risk prevention; Recommend increasing water intake to 4 glasses a day.   Next appointment: 2:00 PM today  Preventive Care 65 Years and Older, Male Preventive care refers to lifestyle choices and visits with your health care provider that can promote health and wellness. What does preventive care include?  A yearly physical exam. This is also called an annual well check.  Dental exams once or twice a year.  Routine eye exams. Ask your health care provider how often you should have your eyes checked.  Personal lifestyle choices, including:  Daily care of your teeth and gums.  Regular physical activity.  Eating a healthy diet.  Avoiding tobacco and drug use.  Limiting alcohol use.  Practicing safe sex.  Taking low doses of aspirin every day.  Taking vitamin and mineral supplements as recommended by your health care provider. What happens during an annual well check? The services and screenings done by your health care provider during your annual well check will depend on your age, overall health, lifestyle risk factors, and family history of disease. Counseling  Your health care provider may ask you questions about your:  Alcohol  use.  Tobacco use.  Drug use.  Emotional well-being.  Home and relationship well-being.  Sexual activity.  Eating habits.  History of falls.  Memory and ability to understand (cognition).  Work and work Astron[employer]vironment. Screening  You may have the following tests or measurements:  Height, weight, and BMI.  Blood pressure.  Lipid and cholesterol levels. These may be checked every 5 years, or more frequently if you are over 82 years old.  Skin check.  Lung cancer screening. You may have this screening every year starting at age 82 if you have a 30-pack-year history of smoking and currently smoke or have quit within the past 15 years.  Fecal occult blood test (FOBT) of the stool. You may have this test every year starting at age 82.  Flexible sigmoidoscopy or colonoscopy. You may have a sigmoidoscopy every 5 years or a colonoscopy every 10 years starting at age 82.  Prostate cancer screening. Recommendations will vary depending on your family history and other risks.  Hepatitis C blood test.  Hepatitis B blood test.  Sexually transmitted disease (STD) testing.  Diabetes screening. This is done by checking your blood sugar (glucose) after you have not eaten for a while (fasting). You may have this done every 1-3 years.  Abdominal aortic aneurysm (AAA) screening. You may need this if you are a current or former smoker.  Osteoporosis. You may be screened starting at age 82 if you are at high risk. Talk with your health care provider about your test results, treatment options, and if necessary, the need for more tests. Vaccines  Your health care provider may recommend certain vaccines, such as:  Influenza vaccine.  This is recommended every year.  Tetanus, diphtheria, and acellular pertussis (Tdap, Td) vaccine. You may need a Td booster every 10 years.  Zoster vaccine. You may need this after age 50.  Pneumococcal 13-valent conjugate (PCV13) vaccine. One dose is  recommended after age 79.  Pneumococcal polysaccharide (PPSV23) vaccine. One dose is recommended after age 75. Talk to your health care provider about which screenings and vaccines you need and how often you need them. This information is not intended to replace advice given to you by your health care provider. Make sure you discuss any questions you have with your health care provider. Document Released: 10/28/2015 Document Revised: 06/20/2016 Document Reviewed: 08/02/2015 Elsevier Interactive Patient Education  2017 [employer] Prevention in the Home Falls can cause injuries. They can happen to people of all ages. There are many things you can do to make your home safe and to help prevent falls. What can I do on the outside of my home?  Regularly fix the edges of walkways and driveways and fix any cracks.  Remove anything that might make you trip as you walk through a door, such as a raised step or threshold.  Trim any bushes or trees on the path to your home.  Use bright outdoor lighting.  Clear any walking paths of anything that might make someone trip, such as rocks or tools.  Regularly check to see if handrails are loose or broken. Make sure that both sides of any steps have handrails.  Any raised decks and porches should have guardrails on the edges.  Have any leaves, snow, or ice cleared regularly.  Use sand or salt on walking paths during winter.  Clean up any spills in your garage right away. This includes oil or grease spills. What can I do in the bathroom?  Use night lights.  Install grab bars by the toilet and in the tub and shower. Do not use towel bars as grab bars.  Use non-skid mats or decals in the tub or shower.  If you need to sit down in the shower, use a plastic, non-slip stool.  Keep the floor dry. Clean up any water that spills on the floor as soon as it happens.  Remove soap buildup in the tub or shower regularly.  Attach bath mats  securely with double-sided non-slip rug tape.  Do not have throw rugs and other things on the floor that can make you trip. What can I do in the bedroom?  Use night lights.  Make sure that you have a light by your bed that is easy to reach.  Do not use any sheets or blankets that are too big for your bed. They should not hang down onto the floor.  Have a firm chair that has side arms. You can use this for support while you get dressed.  Do not have throw rugs and other things on the floor that can make you trip. What can I do in the kitchen?  Clean up any spills right away.  Avoid walking on wet floors.  Keep items that you use a lot in easy-to-reach places.  If you need to reach something above you, use a strong step stool that has a grab bar.  Keep electrical cords out of the way.  Do not use floor polish or wax that makes floors slippery. If you must use wax, use non-skid floor wax.  Do not have throw rugs and other things on the floor that can make  you trip. What can I do with my stairs?  Do not leave any items on the stairs.  Make sure that there are handrails on both sides of the stairs and use them. Fix handrails that are broken or loose. Make sure that handrails are as long as the stairways.  Check any carpeting to make sure that it is firmly attached to the stairs. Fix any carpet that is loose or worn.  Avoid having throw rugs at the top or bottom of the stairs. If you do have throw rugs, attach them to the floor with carpet tape.  Make sure that you have a light switch at the top of the stairs and the bottom of the stairs. If you do not have them, ask someone to add them for you. What else can I do to help prevent falls?  Wear shoes that:  Do not have high heels.  Have rubber bottoms.  Are comfortable and fit you well.  Are closed at the toe. Do not wear sandals.  If you use a stepladder:  Make sure that it is fully opened. Do not climb a closed  stepladder.  Make sure that both sides of the stepladder are locked into place.  Ask someone to hold it for you, if possible.  Clearly mark and make sure that you can see:  Any grab bars or handrails.  First and last steps.  Where the edge of each step is.  Use tools that help you move around (mobility aids) if they are needed. These include:  Canes.  Walkers.  Scooters.  Crutches.  Turn on the lights when you go into a dark area. Replace any light bulbs as soon as they burn out.  Set up your furniture so you have a clear path. Avoid moving your furniture around.  If any of your floors are uneven, fix them.  If there are any pets around you, be aware of where they are.  Review your medicines with your doctor. Some medicines can make you feel dizzy. This can increase your chance of falling. Ask your doctor what other things that you can do to help prevent falls. This information is not intended to replace advice given to you by your health care provider. Make sure you discuss any questions you have with your health care provider. Document Released: 07/28/2009 Document Revised: 03/08/2016 Document Reviewed: 11/05/2014 Elsevier Interactive Patient Education  2017 [employer].

## 2017-11-06 NOTE — Progress Notes (Signed)
Steven Roach  MRN: [id number] DOB: [birth year]  Subjective:  HPI   The patient is an 84 year old male who presents for follow up of chronic illness.  He was seen earlier today for his annual wellness with the nurse health advisor. Patient had labs on 03/06/17 that included A1C, TSH, Lipids, Met C and CBC.  Immunization History  Administered Date(s) Administered  . Influenza Split 07/23/2014, 07/19/2015  . Influenza, High Dose Seasonal PF 08/14/2016, 07/08/2017  . Pneumococcal Conjugate-13 11/17/2013  . Pneumococcal Polysaccharide-23 10/15/2006    Hypertension- BP Readings from Last 3 Encounters:  11/06/17 128/64  11/06/17 128/64  07/08/17 130/60   Borderline diabetes-last A1C was done on 03/06/17 and was 6.0  Hyperlipidemia- Lab Results  Component Value Date   CHOL 152 03/06/2017   HDL 35 (L) 03/06/2017   LDLCALC 91 03/06/2017   TRIG 131 03/06/2017   GERD  Depression/GAD- Depression screen [facility] 2/9 11/06/2017 03/06/2017  Decreased Interest 0 3  Down, Depressed, Hopeless 1 3  PHQ - 2 Score 1 6  Altered sleeping - 2  Tired, decreased energy - 2  Change in appetite - 1  Feeling bad or failure about yourself  - 3  Trouble concentrating - 3  Moving slowly or fidgety/restless - 3  Suicidal thoughts - 3  PHQ-9 Score - 23   Patient also has a bed sore on his bottom that is bothering him. The patient's daughter reports that it has been there for quite sometime.   Patient Active Problem List   Diagnosis Date Noted  . Borderline diabetes 03/06/2017  . GERD without esophagitis 03/06/2017  . Hyperlipidemia 03/06/2017  . Stomach ulcer 03/06/2017  . Depression 03/06/2017  . History of chickenpox 03/06/2017  . History of measles 03/06/2017  . History of mumps 03/06/2017  . Bilateral renal cysts 03/06/2017  . Impotence 03/06/2017  . Hypertension 03/06/2017  . GAD (generalized anxiety disorder) 02/27/2016  . CAD (coronary artery disease) 05/07/2012  . PD  (Parkinson's disease) ([place]) 05/07/2012  . S/P CABG (coronary artery bypass graft) 05/07/2012    Past Medical History:  Diagnosis Date  . GERD (gastroesophageal reflux disease)   . Parkinson's disease [facility])     Social History   Socioeconomic History  . Marital status: Widowed    Spouse name: Not on file  . Number of children: 2  . Years of education: Not on file  . Highest education level: 12th grade  Social Needs  . Financial resource strain: Not hard at all  . Food insecurity - worry: Never true  . Food insecurity - inability: Never true  . Transportation needs - medical: No  . Transportation needs - non-medical: No  Occupational History  . Not on file  Tobacco Use  . Smoking status: Former Smoker    Packs/day: 1.00    Years: 15.00    Pack years: 15.00    Types: Cigarettes    Last attempt to quit: 10/16/1963    Years since quitting: 54.0  . Smokeless tobacco: Never Used  Substance and Sexual Activity  . Alcohol use: No  . Drug use: No  . Sexual activity: No  Other Topics Concern  . Not on file  Social History Narrative  . Not on file    Outpatient Encounter Medications as of 11/06/2017  Medication Sig Note  . acetaminophen (TYLENOL) 500 MG tablet Take 500 mg by mouth every 6 (six) hours as needed.   Marland Kitchen aluminum hydroxide-magnesium carbonate (GAVISCON) 95-358 MG/15ML SUSP  Take 15 mLs by mouth every evening. For heartburn   . aspirin 81 MG tablet Take 81 mg by mouth daily.   Marland Kitchen atorvastatin (LIPITOR) 20 MG tablet Take 20 mg by mouth daily.   . carbidopa-levodopa (SINEMET IR) 25-100 MG tablet Take 1.5 tablets by mouth 3 (three) times daily. TAKE ONE (1.5) TABLET THREE (3) TIMES EACH DAY To be faxed to [place] in [place].   . dextromethorphan-guaiFENesin (MUCINEX DM) 30-600 MG 12hr tablet Take 1 tablet by mouth 2 (two) times daily as needed for cough.   . docusate sodium (COLACE) 100 MG capsule Take 100 mg by mouth every other day.   . finasteride (PROSCAR) 5 MG tablet Take 5  mg by mouth daily.  04/12/2014: Received from: [place]  . lansoprazole (PREVACID) 30 MG capsule TAKE ONE CAPSULE BY MOUTH TWICE A DAY   . ondansetron (ZOFRAN) 4 MG tablet Take 4 mg by mouth every 8 (eight) hours as needed for nausea or vomiting.   . salicyclic acid-sulfur (SEBULEX) 2-2 % shampoo Apply topically.   . sertraline (ZOLOFT) 100 MG tablet TAKE 1 TABLET BY MOUTH ONCE DAILY FOR DEPRESSION   . Skin Protectants, Misc. (BAZA PROTECT EX) Apply topically 3 (three) times daily.   . tamsulosin (FLOMAX) 0.4 MG CAPS capsule Take 0.4 mg by mouth daily.  04/12/2014: Received from: [place]   No facility-administered encounter medications on file as of 11/06/2017.     Allergies  Allergen Reactions  . Celecoxib Other (See Comments)    Other Reaction: GI Upset, gastritis    Review of Systems  Constitutional: Negative.   HENT: Negative.   Eyes: Negative.   Respiratory: Negative.   Cardiovascular: Negative.   Gastrointestinal: Negative.   Genitourinary: Negative.   Musculoskeletal: Negative.   Skin: Negative.        Redness   Neurological: Negative.   Endo/Heme/Allergies: Negative.   Psychiatric/Behavioral: Negative.     Objective:   BP  128/64 (BP Location: Right Arm)   Pulse  84   Ht  5' 9" (1.753 m)   Wt  145 lb 6.4 oz (66 kg)   BMI  21.47 kg/m        Physical Exam  Constitutional: He is oriented to person, place, and time and well-developed, well-nourished, and in no distress.  HENT:  Head: Normocephalic and atraumatic.  Right Ear: External ear normal.  Left Ear: External ear normal.  Nose: Nose normal.  Mouth/Throat: Oropharynx is clear and moist.  Eyes: Conjunctivae and EOM are normal. Pupils are equal, round, and reactive to light.  Neck: Normal range of motion. Neck supple.  Cardiovascular: Normal rate, regular rhythm, normal heart sounds and intact distal pulses.  Pulmonary/Chest: Effort normal and breath sounds  normal.  Abdominal: Soft. Bowel sounds are normal.  Genitourinary: Rectum normal, prostate normal and penis normal.  Musculoskeletal: Normal range of motion.  Neurological: He is alert and oriented to person, place, and time. Gait normal. GCS score is 15.  Parkinsons stigmata.  Skin: Skin is warm and dry.  Nevus left nostril which has been seen by dermatology.  Psychiatric: Mood, memory, affect and judgment normal.    Assessment and Plan :  Essential hypertension  Borderline diabetes  Other hyperlipidemia  Other depression  Progressive Parkinsons Disease Recent Weight loss of 5 lbs Fatigue/Weakness Progression of PD and aging.Daughter/family aware of poor long term prognosis.  I have done the exam and reviewed the chart and it is accurate to the  best of my knowledge. Development worker, community has been used and  any errors in dictation or transcription are unintentional. Miguel Aschoff M.D. [facility]

## 2017-11-06 NOTE — Progress Notes (Signed)
Subjective:   Steven Roach is a 84 y.o. male who presents for an Initial Medicare Annual Wellness Visit.  Review of Systems  N/A  Cardiac Risk Factors include: advanced age (>2155men, 42>65 women);dyslipidemia;hypertension;male gender    Objective:    Today's Vitals   11/06/17 1333 11/06/17 1343  BP: 128/64   Pulse: 84   Weight: 145 lb 6.4 oz (66 kg)   Height: 5\' 9"  (1.753 m)   PainSc: 8  8   PainLoc: Buttocks    Body mass index is 21.47 kg/m.  Advanced Directives 11/06/2017 07/08/2017 07/16/2015  Does Patient Have a Medical Advance Directive? Yes Yes Yes  Type of Estate agentAdvance Directive Healthcare Power of [place];Living will Living will Healthcare Power of Attorney  Does patient want to make changes to medical advance directive? - - No - Patient declined  Copy of Healthcare Power of Attorney in Chart? No - copy requested - No - copy requested    Current Medications (verified) Outpatient Encounter Medications as of 11/06/2017  Medication Sig  . acetaminophen (TYLENOL) 500 MG tablet Take 500 mg by mouth every 6 (six) hours as needed.  Marland Kitchen. aluminum hydroxide-magnesium carbonate (GAVISCON) 95-358 MG/15ML SUSP Take 15 mLs by mouth every evening. For heartburn  . aspirin 81 MG tablet Take 81 mg by mouth daily.  Marland Kitchen. atorvastatin (LIPITOR) 20 MG tablet Take 20 mg by mouth daily.  . carbidopa-levodopa (SINEMET IR) 25-100 MG tablet Take 1.5 tablets by mouth 3 (three) times daily. TAKE ONE (1.5) TABLET THREE (3) TIMES EACH DAY To be faxed to [facility] [place].  . dextromethorphan-guaiFENesin (MUCINEX DM) 30-600 MG 12hr tablet Take 1 tablet by mouth 2 (two) times daily as needed for cough.  . docusate sodium (COLACE) 100 MG capsule Take 100 mg by mouth every other day.  . finasteride (PROSCAR) 5 MG tablet Take 5 mg by mouth daily.   . lansoprazole (PREVACID) 30 MG capsule TAKE ONE CAPSULE BY MOUTH TWICE A DAY  . ondansetron (ZOFRAN) 4 MG tablet Take 4 mg by mouth every 8 (eight) hours as  needed for nausea or vomiting.  . salicyclic acid-sulfur (SEBULEX) 2-2 % shampoo Apply topically.  . sertraline (ZOLOFT) 100 MG tablet TAKE 1 TABLET BY MOUTH ONCE DAILY FOR DEPRESSION  . Skin Protectants, Misc. (BAZA PROTECT EX) Apply topically 3 (three) times daily.  . tamsulosin (FLOMAX) 0.4 MG CAPS capsule Take 0.4 mg by mouth daily.   . [DISCONTINUED] azithromycin (ZITHROMAX) 250 MG tablet Take 2 tablets on the first day then one daily until finished.   No facility-administered encounter medications on file as of 11/06/2017.     Allergies (verified) Celecoxib   History: Past Medical History:  Diagnosis Date  . GERD (gastroesophageal reflux disease)   . Parkinson's disease [facility](HCC)    Past Surgical History:  Procedure Laterality Date  . CARDIAC SURGERY     bypass-at [facility]  . CATARACT EXTRACTION, BILATERAL     Family History  Problem Relation Age of Onset  . Diabetes Mother   . Breast cancer Mother   . Heart attack Sister   . Diabetes Brother    Social History   Socioeconomic History  . Marital status: Widowed    Spouse name: None  . Number of children: 2  . Years of education: None  . Highest education level: 12th grade  Social Needs  . Financial resource strain: Not hard at all  . Food insecurity - worry: Never true  . Food insecurity - inability: Never  true  . Transportation needs - medical: No  . Transportation needs - non-medical: No  Occupational History  . None  Tobacco Use  . Smoking status: Former Smoker    Packs/day: 1.00    Years: 15.00    Pack years: 15.00    Types: Cigarettes    Last attempt to quit: 10/16/1963    Years since quitting: 54.0  . Smokeless tobacco: Never Used  Substance and Sexual Activity  . Alcohol use: No  . Drug use: No  . Sexual activity: No  Other Topics Concern  . None  Social History Narrative  . None   Tobacco Counseling Counseling given: Not Answered   Clinical Intake:  Pre-visit preparation completed: Yes  Pain  : 0-10 Pain Score: 8  Pain Type: Chronic pain(going on for over a year) Pain Location: Buttocks(bed wound) Pain Descriptors / Indicators: Burning Pain Frequency: Intermittent     Nutritional Status: BMI of 19-24  Normal Nutritional Risks: None Diabetes: No  How often do you need to have someone help you when you read instructions, pamphlets, or other written materials from your doctor or pharmacy?: 4 - Often  Interpreter Needed?: No  Information entered by :: [facility], LPN  Activities of Daily Living In your present state of health, do you have any difficulty performing the following activities: 11/06/2017 03/06/2017  Hearing? Steven Roach  Comment Wears bilateral hearing aids -  Vision? Steven Roach  Comment Does f/u with [facility] -  Difficulty concentrating or making decisions? Steven Roach  Walking or climbing stairs? Y Y  Comment Due to Parkinsons -  Dressing or bathing? Y Y  Doing errands, shopping? Steven Roach  Preparing Food and eating ? Y -  Using the Toilet? Y -  In the past six months, have you accidently leaked urine? Y -  Comment Wears depends -  Do you have problems with loss of bowel control? N -  Managing your Medications? N -  Managing your Finances? N -  Housekeeping or managing your Housekeeping? N -  Some recent data might be hidden     Immunizations and Health Maintenance Immunization History  Administered Date(s) Administered  . Influenza Split 07/23/2014, 07/19/2015  . Influenza, High Dose Seasonal PF 08/14/2016, 07/08/2017  . Pneumococcal Conjugate-13 11/17/2013  . Pneumococcal Polysaccharide-23 10/15/2006   Health Maintenance Due  Topic Date Due  . TETANUS/TDAP  07/04/1948    Patient Care Team: Maple Hudson., MD as PCP - General (Family Medicine) Huston Foley, MD as Attending Physician (Neurology)  Indicate any recent Medical Services you may have received from other than [facility] providers in the past year (date may be approximate).    Assessment:    This is a routine wellness examination for Steven Roach.  Hearing/Vision screen Vision Screening Comments: Pt goes to [facility] for vision checks every 1-2 years.   Dietary issues and exercise activities discussed: Current Exercise Habits: The patient does not participate in regular exercise at present, Exercise limited by: Other - see comments(Due to Parkinsons)  Goals    . DIET - INCREASE WATER INTAKE     Recommend increasing water intake to 4 glasses a day.       Depression Screen PHQ 2/9 Scores 11/06/2017 03/06/2017  PHQ - 2 Score 1 6  PHQ- 9 Score - 23    Fall Risk Fall Risk  11/06/2017 03/06/2017  Falls in the past year? Yes Yes  Number falls in past yr: 2 or more 1  Injury with Fall? Yes Yes  Comment stress fracture to right wrist 12/2016 -  Risk Factor Category  High Fall Risk -  Risk for fall due to : Other (Comment);History of fall(s);Impaired mobility -  Risk for fall due to: Comment parkinsons -  Follow up Falls prevention discussed Falls evaluation completed;Education provided;Follow up appointment    Is the patient's home free of loose throw rugs in walkways, pet beds, electrical cords, etc?   yes      Grab bars in the bathroom? yes      Handrails on the stairs? N/A      Adequate lighting?   yes  Timed Get Up and Go performed: N/A  Cognitive Function: Daughter declined screening today.   Screening Tests Health Maintenance  Topic Date Due  . TETANUS/TDAP  07/04/1948  . INFLUENZA VACCINE  Completed  . PNA vac Low Risk Adult  Completed    Qualifies for Shingles Vaccine? Due for Shingles vaccine. Declined my offer to administer today. Education has been provided regarding the importance of this vaccine. Pt has been advised to call her insurance company to determine her out of pocket expense. Advised she may also receive this vaccine at her local pharmacy or Health Dept. Verbalized acceptance and understanding.  Cancer Screenings: Lung: Low Dose CT Chest  recommended if Age 21-80 years, 30 pack-year currently smoking OR have quit w/in 15years. Patient does not qualify. Colorectal: N/A  Additional Screenings:  Hepatitis B/HIV/Syphillis: Pt declines today.  Hepatitis C Screening: Pt declines today.       Plan:  I have personally reviewed and addressed the Medicare Annual Wellness questionnaire and have noted the following in the patient's chart:  A. Medical and social history B. Use of alcohol, tobacco or illicit drugs  C. Current medications and supplements D. Functional ability and status E.  Nutritional status F.  Physical activity G. Advance directives H. List of other physicians I.  Hospitalizations, surgeries, and ER visits in previous 12 months J.  Vitals K. Screenings such as hearing and vision if needed, cognitive and depression L. Referrals and appointments - none  In addition, I have reviewed and discussed with patient certain preventive protocols, quality metrics, and best practice recommendations. A written personalized care plan for preventive services as well as general preventive health recommendations were provided to patient.  See attached scanned questionnaire for additional information.   Signed,  Hyacinth Meeker, LPN Nurse Health Advisor   Nurse Recommendations: None. Pt declined the tetanus today.

## 2017-11-28 ENCOUNTER — Other Ambulatory Visit: Payer: Self-pay | Admitting: Family Medicine

## 2017-12-02 ENCOUNTER — Telehealth: Payer: Self-pay | Admitting: Emergency Medicine

## 2017-12-02 NOTE — Telephone Encounter (Signed)
Error

## 2017-12-05 ENCOUNTER — Telehealth: Payer: Self-pay | Admitting: Emergency Medicine

## 2017-12-05 NOTE — Telephone Encounter (Signed)
Called cindy to schedule pt appt. LMTCB. He needs 3 month follow up from 11/06/17 OV.

## 2017-12-06 ENCOUNTER — Other Ambulatory Visit: Payer: Self-pay | Admitting: Neurology

## 2017-12-26 ENCOUNTER — Ambulatory Visit (INDEPENDENT_AMBULATORY_CARE_PROVIDER_SITE_OTHER): Payer: Medicare Other | Admitting: Neurology

## 2017-12-26 ENCOUNTER — Encounter: Payer: Self-pay | Admitting: Neurology

## 2017-12-26 VITALS — BP 141/70 | HR 93 | Ht 69.0 in | Wt 142.0 lb

## 2017-12-26 DIAGNOSIS — G20 Parkinson's disease: Secondary | ICD-10-CM | POA: Diagnosis not present

## 2017-12-26 MED ORDER — CARBIDOPA-LEVODOPA 25-100 MG PO TABS: ORAL_TABLET | ORAL | 3 refills | Status: AC

## 2017-12-26 NOTE — Progress Notes (Signed)
Subjective:    Patient ID: Steven Roach is a 84 y.o. male.  HPI     Interim history:   [name] is an 84 year old right-handed gentleman with an underlying medical history of reflux disease, reflux disease, history of lacunar stroke, peptic ulcer disease, hyperlipidemia, borderline diabetes, depression, hypertension, anxiety, coronary artery disease with status post CABG, and remote history of smoking, who presents for follow-up consultation of his Parkinson's disease. The patient is accompanied by his son and daughter today. I first met him on 04/23/2017 at the request of his primary care physician, at which time his daughter reported a diagnosis of Parkinson's disease in 2010. I suggested he continue with his Sinemet at 1-1/2 pill 3 times a day. I sent a prescription renewal to the [place] as requested by his family. I suggested a six-month follow-up.  Today, 12/26/2017: He reports loss of appetite and he has indeed lost about 8 pounds since July 2018. He complains of stiffness. It is difficult for him to turn and stand. Thankfully, he has had no falls recently. He uses a walker, physical therapy was discontinued because of lack of progress. He is still taking Sinemet 1-1/2 pills 3 times a day. [facility]  The patient's allergies, current medications, family history, past medical history, past social history, past surgical history and problem list were reviewed and updated as appropriate.   Previously:  04/23/2017: (He) reports a diagnosis of Parkinson's disease. His history is primarily supplied by his daughter and also his daughter-in-law. He was diagnosed in 2010. Symptoms started with balance issues, gait difficulty, global weakness, fatigue, stiffness, and decrease in arm swing, probably on the left. He has had recurrent falls. He uses a rolling walker. He cannot sleep in the bed and has slept in his recliner for the past 3 years  or so. He has had some intermittent visual hallucinations, probably worse since his wife's passing. Sadly, he recently lost his wife in March 2018. She also had Parkinson's disease as I understand and had recurrent falls. They moved into assisted living in May 2017. Prior to that about 3 years ago they moved into independent living but because of her recurrent falls they could not continue to be an independent living. He has been on Zoloft. Of note, he previously followed with Dr. Jennelle Human at [facility], last seen on 06/05/2016, I reviewed the note. He also saw Dr. Maxine Glenn in the past, neurologist at [facility]. His been on Sinemet. He currently takes 1-1/2 pills 3 times a day. He has a prior history of oral facial dyskinesias as per chart review. He has also been seen by the [place]. in fact, his Sinemet prescription is usually through the [place] interim. He needs a new prescription per daughter. Of note, he had a brain MRI with and without contrast on 04/15/2012 which I reviewed: IMPRESSION:   1.  Chronic and involutional changes.  2.  Area of subacute to chronic lacunar infarction within the pons on the  right.  3.  No evidence of acute abnormalities.   He lives in assisted living in [place]. He has had issues recently with a decubitus sore. He quit smoking over 30 years ago. He does not currently drink alcohol, he doesn't currently drink caffeine on a daily basis. He has 2 children, one son, one daughter, 3 granddaughters, 4 great-grandchildren. He does not drink enough water, maybe one cup per day. He has had some urinary incontinence and daughter-in-law requested  we check his urinalysis but he was not able to go.  His Past Medical History Is Significant For: Past Medical History:  Diagnosis Date  . GERD (gastroesophageal reflux disease)   . Parkinson's disease ([place])     His Past Surgical History Is Significant For: Past Surgical History:  Procedure Laterality Date  . CARDIAC SURGERY     bypass-at [facility]   . CATARACT EXTRACTION, BILATERAL      His Family History Is Significant For: Family History  Problem Relation Age of Onset  . Diabetes Mother   . Breast cancer Mother   . Heart attack Sister   . Diabetes Brother     His Social History Is Significant For: Social History   Socioeconomic History  . Marital status: Widowed    Spouse name: None  . Number of children: 2  . Years of education: None  . Highest education level: 12th grade  Social Needs  . Financial resource strain: Not hard at all  . Food insecurity - worry: Never true  . Food insecurity - inability: Never true  . Transportation needs - medical: No  . Transportation needs - non-medical: No  Occupational History  . None  Tobacco Use  . Smoking status: Former Smoker    Packs/day: 1.00    Years: 15.00    Pack years: 15.00    Types: Cigarettes    Last attempt to quit: 10/16/1963    Years since quitting: 54.2  . Smokeless tobacco: Never Used  Substance and Sexual Activity  . Alcohol use: No  . Drug use: No  . Sexual activity: No  Other Topics Concern  . None  Social History Narrative  . None    His Allergies Are:  Allergies  Allergen Reactions  . Celecoxib Other (See Comments)    Other Reaction: GI Upset, gastritis  :   His Current Medications Are:  Outpatient Encounter Medications as of 12/26/2017  Medication Sig  . acetaminophen (TYLENOL) 500 MG tablet Take 500 mg by mouth every 6 (six) hours as needed.  Marland Kitchen aluminum hydroxide-magnesium carbonate (GAVISCON) 95-358 MG/15ML SUSP Take 15 mLs by mouth every evening. For heartburn  . aspirin 81 MG tablet Take 81 mg by mouth daily.  Marland Kitchen atorvastatin (LIPITOR) 20 MG tablet Take 20 mg by mouth daily.  . carbidopa-levodopa (SINEMET IR) 25-100 MG tablet TAKE 1.5 TABLETS BY MOUTH 3 TIMES DAILY FOR PARKINSON'S  . dextromethorphan-guaiFENesin (MUCINEX DM) 30-600 MG 12hr tablet Take 1 tablet by mouth 2 (two) times daily as needed for cough.  . docusate sodium  (COLACE) 100 MG capsule Take 100 mg by mouth every other day.  . finasteride (PROSCAR) 5 MG tablet Take 5 mg by mouth daily.   . lansoprazole (PREVACID) 30 MG capsule TAKE 1 CAPSULE BY MOUTH 2 TIMES PER DAY FOR REFLUX  . ondansetron (ZOFRAN) 4 MG tablet Take 4 mg by mouth every 8 (eight) hours as needed for nausea or vomiting.  . salicyclic acid-sulfur (SEBULEX) 2-2 % shampoo Apply topically.  . sertraline (ZOLOFT) 50 MG tablet Take 50 mg by mouth daily.  . Skin Protectants, Misc. (BAZA PROTECT EX) Apply topically 3 (three) times daily.  . tamsulosin (FLOMAX) 0.4 MG CAPS capsule Take 0.4 mg by mouth daily.   . [DISCONTINUED] sertraline (ZOLOFT) 100 MG tablet TAKE 1 TABLET BY MOUTH ONCE DAILY FOR DEPRESSION   No facility-administered encounter medications on file as of 12/26/2017.   :  Review of Systems:  Out of a complete 14 point review  of systems, all are reviewed and negative with the exception of these symptoms as listed below:  Review of Systems  Neurological:       Pt presents today to discuss his PD. Pt's family report more stiffness.    Objective:  Neurological Exam  Physical Exam Physical Examination:   Vitals:   12/26/17 1400  BP: (!) 141/70  Pulse: 93    General Examination: The patient is a very pleasant 84 y.o. male in no acute distress. He appears more frail. He has lost weight.  HEENT: Normocephalic, atraumatic, pupils are equal, round and reactive to light and accommodation. Pupils are very small, funduscopic exam is not possible. Extraocular tracking shows severe difficulty with tracking. saccadic breakdown. No nystagmus. He has mild oral co-contractions upon motor testing. No telltale dyskinesias noted in his upper body or head and neck area, neck is moderately to severely rigid. He has decreased range of motion upon passive mobility as well. Hearing is impaired. Speech is moderately hypophonic, minimally dysarthric.  Cardiovascular exam in general exam are  nonfocal. Chest is clear to auscultation, heart sounds are regular, no murmur noted.   He has no significant lower extremity swelling.   Neurologically:  Mental status: The patient is awake and alert, paying good  attention. His short-term memory is mildly impaired. There is some delay in responding. Of note he is also hearing impaired. He is not able to provide his own history, his daughter and son provide most of his history. Cranial nerves are as described above under HEENT exam. In addition, shoulder shrug is normal with equal shoulder height noted.   Motor exam: Thin bulk, global strength of 4- out of 5 is noted. Tone is increased throughout, without significant cogwheeling noted, no resting tremor noted today. Fine motor skills are moderate to severely impaired bilaterally, slightly worse perhaps on the left.  He stands with significant difficulty and requires assistance. His posture is moderately stooped and he has a tendency to fall backwards, balance is impaired. He is using his rolling walker with difficulty with some start hesitation noted, no freezing, turns with difficulty. Sensory exam is intact to light touch.   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 His history and physical exam are in keeping with akinetic-rigid type Parkinson's disease, with some lateralization noted to the left. He has had falls, he has impaired balance, no significant dyskinesias noted, some history of mild intermittent visual hallucinations, mild memory loss, both stable. I suggested we continue with his current regimen of Sinemet 1-1/2  pills 3 times a day and I provided a renewed prescription for 90 days with refills. Hopefully, they can fill the prescription eventually at the [place]. He is established in assisted living, therapy was discontinued due to lack of progress. He has lost weight. They are encouraged to monitor his weight and encourage more food intake as well as supplementation with boost or ensure. I will see him back in 6 months, sooner if needed. I answered all their questions today and the patient and his son and daughter were in agreement.  I spent 25 minutes in total face-to-face time with the patient, more than 50% of which was spent  in counseling and coordination of care, reviewing test results, reviewing medication and discussing or reviewing the diagnosis of PD, its prognosis and treatment options. Pertinent laboratory and imaging test results that were available during this visit with the patient were reviewed by me and considered in my medical decision making (see chart for details).

## 2017-12-26 NOTE — Patient Instructions (Signed)
As discussed, we will leave your Sinemet the same at 1-1/2 pill 3 times a day, I renewed the prescription for 90 days with refills. Please continue to use your walker at all times, you are at fall risk. Please try to stay well-hydrated. Please monitor your weight, try to add in boost or ensure as a supplement once a day if possible. I will see you back in 6 months, sooner if needed.

## 2018-01-03 ENCOUNTER — Emergency Department: Payer: Medicare Other

## 2018-01-03 ENCOUNTER — Encounter: Payer: Self-pay | Admitting: Emergency Medicine

## 2018-01-03 ENCOUNTER — Other Ambulatory Visit: Payer: Self-pay

## 2018-01-03 ENCOUNTER — Emergency Department
Admission: EM | Admit: 2018-01-03 | Discharge: 2018-01-03 | Disposition: A | Discharge status: Home / Self Care | Payer: Medicare Other | Attending: Emergency Medicine | Admitting: Emergency Medicine

## 2018-01-03 DIAGNOSIS — F329 Major depressive disorder, single episode, unspecified: Secondary | ICD-10-CM | POA: Insufficient documentation

## 2018-01-03 DIAGNOSIS — G20 Parkinson's disease: Secondary | ICD-10-CM | POA: Insufficient documentation

## 2018-01-03 DIAGNOSIS — Z951 Presence of aortocoronary bypass graft: Secondary | ICD-10-CM | POA: Diagnosis not present

## 2018-01-03 DIAGNOSIS — Y92129 Unspecified place in nursing home as the place of occurrence of the external cause: Secondary | ICD-10-CM | POA: Insufficient documentation

## 2018-01-03 DIAGNOSIS — Z87891 Personal history of nicotine dependence: Secondary | ICD-10-CM | POA: Insufficient documentation

## 2018-01-03 DIAGNOSIS — S79911A Unspecified injury of right hip, initial encounter: Secondary | ICD-10-CM | POA: Diagnosis present

## 2018-01-03 DIAGNOSIS — Y9389 Activity, other specified: Secondary | ICD-10-CM | POA: Diagnosis not present

## 2018-01-03 DIAGNOSIS — W08XXXA Fall from other furniture, initial encounter: Secondary | ICD-10-CM | POA: Insufficient documentation

## 2018-01-03 DIAGNOSIS — S7001XA Contusion of right hip, initial encounter: Secondary | ICD-10-CM | POA: Insufficient documentation

## 2018-01-03 DIAGNOSIS — I10 Essential (primary) hypertension: Secondary | ICD-10-CM | POA: Diagnosis not present

## 2018-01-03 DIAGNOSIS — Y998 Other external cause status: Secondary | ICD-10-CM | POA: Diagnosis not present

## 2018-01-03 DIAGNOSIS — K5730 Diverticulosis of large intestine without perforation or abscess without bleeding: Secondary | ICD-10-CM | POA: Diagnosis not present

## 2018-01-03 DIAGNOSIS — S7000XA Contusion of unspecified hip, initial encounter: Secondary | ICD-10-CM

## 2018-01-03 DIAGNOSIS — W19XXXA Unspecified fall, initial encounter: Secondary | ICD-10-CM

## 2018-01-03 MED ORDER — ACETAMINOPHEN 500 MG PO TABS
1000.0000 mg | ORAL_TABLET | Freq: Once | ORAL | Status: AC
  Administered 2018-01-03: 1000 mg via ORAL
  Filled 2018-01-03: qty 2

## 2018-01-03 NOTE — ED Notes (Signed)
[name]'s son came up to the desk asking about leaving and transport back to the facility.  This RN informed him that the doctor had placed orders for CT scan and lab work.  Son stated that patient did not need a CT scan because he did not hit his head.  This RN explained that since fall was unwitnessed and patient seemed to have weakness on one side, this was likely why the MD ordered the CT scan.  Son stated they would still like to refuse CT scan.

## 2018-01-03 NOTE — ED Triage Notes (Addendum)
Pt [facility], family states patient dropped his remote on the floor and bent over the pick it up and fell out of the recliner.  Family states he was found sitting on the floor after he fell. Pt has hx of Parkinson's Disease.  Daughter states that the home health nurse was working with the patient today when she noticed the patient was dragging his right leg and favoring his right leg.  Pt normally walks with a walker.  Pt is HOH. Daughter states no bruising is present does not notice any abnormalities. Pt has been taking Tylenol for the pain around the clock per daughter-unsure of when the last dose was.  Pt currently sitting in [facility] from [facility].

## 2018-01-03 NOTE — ED Notes (Signed)
Patient's daughter came up to the desk stating that the facility where patient lives can only transport until 5pm and would need to be transporting patient at this time.  This RN explained to daughter that if they could wait for patient to be seen by a physician, then EMS could transport the patient back to the facility if patient was unable to transfer into a normal vehicle.  This RN apologized to daughter for the long wait and explained that we have higher than normal volumes a this time.

## 2018-01-03 NOTE — ED Notes (Signed)
First Nurse Note:  Patient fell yesterday around 2:30pm.  Patient did not hit his head.  Today, patient is dragging his right leg when walking.  Patient was brought by his daughter and lives at assisted living.  Patient is very hard of hearing.

## 2018-01-03 NOTE — ED Notes (Signed)
Pt presents today post fall. [facility] is [facility] and was reaching for remote when fell out of recliner. Pt has no deformities nor any rotation. Pt is NAD with family at bedside at this time.

## 2018-01-03 NOTE — ED Provider Notes (Signed)
[facility] Emergency Department Provider Note       Time seen: ----------------------------------------- 5:58 PM on 01/03/2018 -----------------------------------------   I have reviewed the triage vital signs and the nursing notes.  HISTORY   Chief Complaint Fall    HPI Steven Roach is a 84 y.o. male with a history of GERD and Parkinson's disease who presents to the ED for a slip and fall that occurred today while at his nursing home.  Patient arrives from home place where he was reported that he dropped his remote and bent over to pick it up and slipped out of his recliner.  Patient fell on 1 of his hips.  Initially patient states his left but family has noticed that he is favoring his right leg and is complaining of right hip pain.  He normally walks with a walker.  There is not been any bruising present.  The fall occurred last night.  Past Medical History:  Diagnosis Date  . GERD (gastroesophageal reflux disease)   . Parkinson's disease [facility](HCC)     Patient Active Problem List   Diagnosis Date Noted  . Borderline diabetes 03/06/2017  . GERD without esophagitis 03/06/2017  . Hyperlipidemia 03/06/2017  . Stomach ulcer 03/06/2017  . Depression 03/06/2017  . History of chickenpox 03/06/2017  . History of measles 03/06/2017  . History of mumps 03/06/2017  . Bilateral renal cysts 03/06/2017  . Impotence 03/06/2017  . Hypertension 03/06/2017  . GAD (generalized anxiety disorder) 02/27/2016  . CAD (coronary artery disease) 05/07/2012  . PD (Parkinson's disease) (HCC) 05/07/2012  . S/P CABG (coronary artery bypass graft) 05/07/2012    Past Surgical History:  Procedure Laterality Date  . CARDIAC SURGERY     bypass-at [facility]  . CATARACT EXTRACTION, BILATERAL      Allergies Celecoxib  Social History Social History   Tobacco Use  . Smoking status: Former Smoker    Packs/day: 1.00    Years: 15.00    Pack years: 15.00    Types:  Cigarettes    Last attempt to quit: 10/16/1963    Years since quitting: 54.2  . Smokeless tobacco: Never Used  Substance Use Topics  . Alcohol use: No  . Drug use: No   Review of Systems Constitutional: Negative for fever. Cardiovascular: Negative for chest pain. Respiratory: Negative for shortness of breath. Gastrointestinal: Negative for abdominal pain, vomiting and diarrhea. Musculoskeletal: Positive for right hip pain Skin: Negative for rash. Neurological: Negative for headaches, focal weakness or numbness.  All systems negative/normal/unremarkable except as stated in the HPI  ____________________________________________   PHYSICAL EXAM:  VITAL SIGNS: ED Triage Vitals [01/03/18 1530]  Enc Vitals Group     BP 122/62     Pulse Rate 77     Resp 20     Temp 97.6 F (36.4 C)     Temp Source Oral     SpO2 96 %     Weight 145 lb (65.8 kg)     Height 5\' 9"  (1.753 m)     Head Circumference      Peak Flow      Pain Score      Pain Loc      Pain Edu?      Excl. in GC?    Constitutional: Alert and oriented.  No distress Eyes: Conjunctivae are normal. Normal extraocular movements. Cardiovascular: Normal rate, regular rhythm. No murmurs, rubs, or gallops. Respiratory: Normal respiratory effort without tachypnea nor retractions. Breath sounds are clear and equal  bilaterally. No wheezes/rales/rhonchi. Gastrointestinal: Soft and nontender. Normal bowel sounds Musculoskeletal: Limited range of motion of the extremities. Neurologic:  Normal speech and language. No gross focal neurologic deficits are appreciated.  Skin:  Skin is warm, dry and intact. No rash noted. Psychiatric: Mood and affect are normal. Speech and behavior are normal.  ___________________________________________  ED COURSE:  As part of my medical decision making, I reviewed the following data within the electronic MEDICAL RECORD NUMBER History obtained from family if available, nursing notes, old chart and ekg, as  well as notes from prior ED visits. Patient presented for fall with hip pain, we will assess with imaging as indicated at this time.   Procedures ____________________________________________   RADIOLOGY Images were viewed by me  Right hip x-rays are negative CT pelvis Is unremarkable ____________________________________________  DIFFERENTIAL DIAGNOSIS   Contusion, pelvic fracture, hip fracture  FINAL ASSESSMENT AND PLAN  Fall, hip pain   Plan: The patient had presented for fall with persistent hip pain and difficulty walking. Patient's imaging was negative including imaging of the pelvis.  No acute bony abnormality was appreciated of either hip.  He was able to ambulate here and was doing better.  Family were agreeable to taking him back to the nursing home.   Ulice Dash, MD   Note: This note was generated in part or whole with voice recognition software. Voice recognition is usually quite accurate but there are transcription errors that can and very often do occur. I apologize for any typographical errors that were not detected and corrected.     Emily Filbert, MD 01/03/18 [phone]

## 2018-01-03 NOTE — ED Notes (Signed)
This RN walked patient and family back to treatment room.  Patient's daughter states, "I am furious at this hospital.  The communication is terrible.  [facility] will be hearing about this."  This RN apologized for their long wait and the miscommunication.  RN explained that due to long waits, the ED physicians were reviewing charts and adding orders they thought would be appropriate and did not realize that family had discussed with triage nurse that they did not want labs and CT.

## 2018-01-03 NOTE — ED Notes (Addendum)
D/w Dr. Don PerkingVeronese, new order received from bilateral hip and pelvis xr. No labs ordered at this time.

## 2018-01-03 NOTE — ED Notes (Signed)
Pt family is at bedside, pt family is angry at the wait time. Pt family is refusing lab work and IV at this time. Pt family is refusing for pt to be placed in on the stretcher. EDP Mayford KnifeWilliams spoke with family and they are agreeable to a scan at this time. RN will continue to monitor.

## 2018-01-03 NOTE — ED Notes (Signed)
This RN called CPOD physician and asked if he would see patient although CT and lab results had not been performed since family was refusing these procedures.  MD agreed.

## 2018-01-17 ENCOUNTER — Other Ambulatory Visit: Payer: Self-pay | Admitting: Emergency Medicine

## 2018-01-17 DIAGNOSIS — E7849 Other hyperlipidemia: Secondary | ICD-10-CM

## 2018-01-17 MED ORDER — ATORVASTATIN CALCIUM 20 MG PO TABS: 20.0000 mg | ORAL_TABLET | Freq: Every day | ORAL | 3 refills | Status: AC

## 2018-03-03 ENCOUNTER — Ambulatory Visit: Payer: Medicare Other | Admitting: Family Medicine

## 2018-03-24 ENCOUNTER — Other Ambulatory Visit: Payer: Self-pay | Admitting: Family Medicine

## 2018-03-24 NOTE — Telephone Encounter (Signed)
[facility] faxed a refill request for the following medication. Thanks CC  aluminum hydroxide-magnesium carbonate (GAVISCON) 95-358 MG/15ML SUSP

## 2018-03-25 MED ORDER — ALUM HYDROXIDE-MAG CARBONATE 95-358 MG/15ML PO SUSP: 15.0000 mL | Freq: Every evening | ORAL | 5 refills | Status: DC

## 2018-04-07 ENCOUNTER — Telehealth: Payer: Self-pay | Admitting: Family Medicine

## 2018-04-07 NOTE — Telephone Encounter (Signed)
Order was written and faxed. Daughter was advised.

## 2018-04-07 NOTE — Telephone Encounter (Signed)
Pt's daughter requesting [facility] for [facility] order for a nurse to see the patient regarding two bed sores on his bottom.

## 2018-04-14 ENCOUNTER — Telehealth: Payer: Self-pay | Admitting: Family Medicine

## 2018-04-14 NOTE — Telephone Encounter (Signed)
Junious DresserConnie with [facility] called wanting verbal orders for OT 1 time a week for this week then 2 times a week for 5 weeks    [phone]  Thanks teri

## 2018-04-15 NOTE — Telephone Encounter (Signed)
ok 

## 2018-04-16 NOTE — Telephone Encounter (Signed)
Advised  Ed 

## 2018-04-19 ENCOUNTER — Encounter: Payer: Self-pay | Admitting: Emergency Medicine

## 2018-04-19 ENCOUNTER — Emergency Department
Admission: EM | Admit: 2018-04-19 | Discharge: 2018-04-19 | Disposition: A | Discharge status: Home / Self Care | Payer: Medicare Other | Attending: Emergency Medicine | Admitting: Emergency Medicine

## 2018-04-19 ENCOUNTER — Emergency Department: Payer: Medicare Other

## 2018-04-19 DIAGNOSIS — G20 Parkinson's disease: Secondary | ICD-10-CM | POA: Insufficient documentation

## 2018-04-19 DIAGNOSIS — Z87891 Personal history of nicotine dependence: Secondary | ICD-10-CM | POA: Insufficient documentation

## 2018-04-19 DIAGNOSIS — Y92129 Unspecified place in nursing home as the place of occurrence of the external cause: Secondary | ICD-10-CM | POA: Insufficient documentation

## 2018-04-19 DIAGNOSIS — I2510 Atherosclerotic heart disease of native coronary artery without angina pectoris: Secondary | ICD-10-CM | POA: Diagnosis not present

## 2018-04-19 DIAGNOSIS — S0990XA Unspecified injury of head, initial encounter: Secondary | ICD-10-CM | POA: Diagnosis present

## 2018-04-19 DIAGNOSIS — W19XXXA Unspecified fall, initial encounter: Secondary | ICD-10-CM

## 2018-04-19 DIAGNOSIS — Y939 Activity, unspecified: Secondary | ICD-10-CM | POA: Insufficient documentation

## 2018-04-19 DIAGNOSIS — S0081XA Abrasion of other part of head, initial encounter: Secondary | ICD-10-CM | POA: Insufficient documentation

## 2018-04-19 DIAGNOSIS — T148XXA Other injury of unspecified body region, initial encounter: Secondary | ICD-10-CM

## 2018-04-19 DIAGNOSIS — Z79899 Other long term (current) drug therapy: Secondary | ICD-10-CM | POA: Insufficient documentation

## 2018-04-19 DIAGNOSIS — W228XXA Striking against or struck by other objects, initial encounter: Secondary | ICD-10-CM | POA: Diagnosis not present

## 2018-04-19 DIAGNOSIS — Z951 Presence of aortocoronary bypass graft: Secondary | ICD-10-CM | POA: Insufficient documentation

## 2018-04-19 DIAGNOSIS — Z7982 Long term (current) use of aspirin: Secondary | ICD-10-CM | POA: Diagnosis not present

## 2018-04-19 DIAGNOSIS — Y999 Unspecified external cause status: Secondary | ICD-10-CM | POA: Insufficient documentation

## 2018-04-19 DIAGNOSIS — I10 Essential (primary) hypertension: Secondary | ICD-10-CM | POA: Insufficient documentation

## 2018-04-19 NOTE — ED Triage Notes (Signed)
Pt arrived via ems after fall that resulted in abrasion to his head. Pt has a history of dementia and is a poor historian.

## 2018-04-19 NOTE — ED Provider Notes (Signed)
[facility] Emergency Department Provider Note  ____________________________________________   I have reviewed the triage vital signs and the nursing notes.   HISTORY  Chief Complaint Fall   History limited by and level 5 caveat due to: Dementia   HPI Real ConsFrank S Yellin is a 84 y.o. male who presents to the emergency department today from living facility after a fall.  It is unclear exactly how the patient fell however family thinks that he was probably trying to walk and fell and hit his head on a dresser.  Does have an abrasion to his head.  States he has a history of recent falls.  Daughter visited the patient earlier today and stated that he was in his normal state of health.   Per medical record review patient has a history of parkinson disease.  Past Medical History:  Diagnosis Date  . GERD (gastroesophageal reflux disease)   . Parkinson's disease [facility](HCC)     Patient Active Problem List   Diagnosis Date Noted  . Borderline diabetes 03/06/2017  . GERD without esophagitis 03/06/2017  . Hyperlipidemia 03/06/2017  . Stomach ulcer 03/06/2017  . Depression 03/06/2017  . History of chickenpox 03/06/2017  . History of measles 03/06/2017  . History of mumps 03/06/2017  . Bilateral renal cysts 03/06/2017  . Impotence 03/06/2017  . Hypertension 03/06/2017  . GAD (generalized anxiety disorder) 02/27/2016  . CAD (coronary artery disease) 05/07/2012  . PD (Parkinson's disease) (HCC) 05/07/2012  . S/P CABG (coronary artery bypass graft) 05/07/2012    Past Surgical History:  Procedure Laterality Date  . CARDIAC SURGERY     bypass-at [facility]  . CATARACT EXTRACTION, BILATERAL      Prior to Admission medications   Medication Sig Start Date End Date Taking? Authorizing Provider  acetaminophen (TYLENOL) 500 MG tablet Take 1 tablet (500MG ) by mouth every night at bedtime as scheduled and 1 tablet every 6 hours as needed for pain   Yes [provider]  aluminum hydroxide-magnesium carbonate (GAVISCON) 95-358 MG/15ML SUSP Take 15 mLs by mouth every evening. For heartburn 03/25/18  Yes Maple HudsonGilbert, Richard L Jr., MD  aspirin 81 MG tablet Take 81 mg by mouth daily.   Yes [provider]  atorvastatin (LIPITOR) 20 MG tablet Take 1 tablet (20 mg total) by mouth daily. 01/17/18  Yes Maple HudsonGilbert, Richard L Jr., MD  carbidopa-levodopa (SINEMET IR) 25-100 MG tablet TAKE 1.5 TABLETS BY MOUTH 3 TIMES DAILY FOR PARKINSON'S 12/26/17  Yes Huston FoleyAthar, Saima, MD  dextromethorphan-guaiFENesin (MUCINEX DM) 30-600 MG 12hr tablet Take 1 tablet by mouth 2 (two) times daily as needed for cough.   Yes [provider]  docusate sodium (COLACE) 100 MG capsule Take 100 mg by mouth every other day.   Yes [provider]  finasteride (PROSCAR) 5 MG tablet Take 5 mg by mouth daily.  03/22/10  Yes [provider]  ondansetron (ZOFRAN) 4 MG tablet Take 4 mg by mouth every 8 (eight) hours as needed for nausea or vomiting.   Yes [provider]  salicyclic acid-sulfur (SEBULEX) 2-2 % shampoo Apply topically twice weekly for seborrheic dermatitis   Yes [provider]  sertraline (ZOLOFT) 100 MG tablet Take 100 mg by mouth daily.    Yes [provider]  Skin Protectants, Misc. (BAZA PROTECT EX) Apply topically 3 (three) times daily.   Yes [provider]  tamsulosin (FLOMAX) 0.4 MG CAPS capsule Take 0.4 mg by mouth daily.  03/22/10  Yes [provider]  lansoprazole (PREVACID) 30 MG capsule TAKE 1 CAPSULE BY MOUTH 2 TIMES PER DAY FOR REFLUX Patient not taking: Reported on 04/19/2018 11/28/17   Maple Hudson., MD    Allergies Celecoxib  Family History  Problem Relation Age of Onset  . Diabetes Mother   . Breast cancer Mother   . Heart attack Sister   . Diabetes Brother     Social History Social History   Tobacco Use  . Smoking status: Former Smoker    Packs/day: 1.00    Years: 15.00    Pack  years: 15.00    Types: Cigarettes    Last attempt to quit: 10/16/1963    Years since quitting: 54.5  . Smokeless tobacco: Never Used  Substance Use Topics  . Alcohol use: No  . Drug use: No    Review of Systems Unable to obtain reliable ROS secondary to dementia.  ____________________________________________   PHYSICAL EXAM:  VITAL SIGNS: ED Triage Vitals [04/19/18 1730]  Enc Vitals Group     BP (!) 146/77     Pulse Rate 75     Resp 20     Temp 97.7 F (36.5 C)     Temp Source Oral     SpO2 95 %     Weight 145 lb (65.8 kg)     Height 6' (1.829 m)    Constitutional: Awake and alert. Not oriented. Eyes: Conjunctivae are normal.  ENT      Head: Normocephalic.      Nose: No congestion/rhinnorhea.      Mouth/Throat: Mucous membranes are moist.      Neck: No stridor. Hematological/Lymphatic/Immunilogical: No cervical lymphadenopathy. Cardiovascular: Normal rate, regular rhythm.  No murmurs, rubs, or gallops.  Respiratory: Normal respiratory effort without tachypnea nor retractions. Breath sounds are clear and equal bilaterally. No wheezes/rales/rhonchi. Gastrointestinal: Soft and non tender. No rebound. No guarding.  Genitourinary: Deferred Musculoskeletal: Normal range of motion in all extremities. No lower extremity edema. Neurologic: Demented, appears to be moving all extremities.  Skin:  Abrasions noted to forehead.  ____________________________________________    LABS (pertinent positives/negatives)  None  ____________________________________________   EKG  None  ____________________________________________    RADIOLOGY  CT head No acute abnormality  Right hip No acute abnormality  ____________________________________________   PROCEDURES  Procedures  ____________________________________________   INITIAL IMPRESSION / ASSESSMENT AND PLAN / ED COURSE  Pertinent labs & imaging results that were available during my care of the patient  were reviewed by me and considered in my medical decision making (see chart for details).   Patient presents to the emergency department today after an unwitnessed fall with abrasions to the forehead.  I had a discussion with the patient's daughter.  At this point she felt comfortable deferring blood work and work-up for other causes of fall.  Head CT and right hip x-ray were performed.  Neither of these showed any acute findings.  Will discharge back to living facility.   ____________________________________________   FINAL CLINICAL IMPRESSION(S) / ED DIAGNOSES  Final diagnoses:  Fall, initial encounter  Abrasion     Note: This dictation was prepared with Dragon dictation. Any transcriptional errors that result from this process are unintentional     Phineas Semen, MD 04/19/18 [phone]

## 2018-04-19 NOTE — Discharge Instructions (Addendum)
Please seek medical attention for any high fevers, chest pain, shortness of breath, change in behavior, persistent vomiting, bloody stool or any other new or concerning symptoms.  

## 2018-04-28 ENCOUNTER — Ambulatory Visit (INDEPENDENT_AMBULATORY_CARE_PROVIDER_SITE_OTHER): Payer: Medicare Other | Admitting: Family Medicine

## 2018-04-28 VITALS — BP 100/50 | HR 68 | Temp 97.9°F | Resp 16 | Wt 140.0 lb

## 2018-04-28 DIAGNOSIS — Z9181 History of falling: Secondary | ICD-10-CM

## 2018-04-28 DIAGNOSIS — G20 Parkinson's disease: Secondary | ICD-10-CM

## 2018-04-28 DIAGNOSIS — Z951 Presence of aortocoronary bypass graft: Secondary | ICD-10-CM

## 2018-04-28 NOTE — Progress Notes (Addendum)
[name]  MRN: [id number] DOB: 1929/04/19  Subjective:  HPI   The patient is an 84 year old male who presents for follow up of chronic health.  The patient's daughter is with him.  She states he has had 2 bad falls since the last time he was seen here.  Both of which required him to be seen in the ER.    The patient is unable to express himself verbally.  The information is being provided by his daughter.  He does have 2 buttock sores that home health is treating.  Patient Active Problem List   Diagnosis Date Noted  . Borderline diabetes 03/06/2017  . GERD without esophagitis 03/06/2017  . Hyperlipidemia 03/06/2017  . Stomach ulcer 03/06/2017  . Depression 03/06/2017  . History of chickenpox 03/06/2017  . History of measles 03/06/2017  . History of mumps 03/06/2017  . Bilateral renal cysts 03/06/2017  . Impotence 03/06/2017  . Hypertension 03/06/2017  . GAD (generalized anxiety disorder) 02/27/2016  . CAD (coronary artery disease) 05/07/2012  . PD (Parkinson's disease) (HCC) 05/07/2012  . S/P CABG (coronary artery bypass graft) 05/07/2012    Past Medical History:  Diagnosis Date  . GERD (gastroesophageal reflux disease)   . Parkinson's disease [facility](HCC)     Social History   Socioeconomic History  . Marital status: Widowed    Spouse name: Not on file  . Number of children: 2  . Years of education: Not on file  . Highest education level: 12th grade  Occupational History  . Not on file  Social Needs  . Financial resource strain: Not hard at all  . Food insecurity:    Worry: Never true    Inability: Never true  . Transportation needs:    Medical: No    Non-medical: No  Tobacco Use  . Smoking status: Former Smoker    Packs/day: 1.00    Years: 15.00    Pack years: 15.00    Types: Cigarettes    Last attempt to quit: 10/16/1963    Years since quitting: 54.5  . Smokeless tobacco: Never Used  Substance and Sexual Activity  . Alcohol use: No  . Drug use:  No  . Sexual activity: Never  Lifestyle  . Physical activity:    Days per week: Not on file    Minutes per session: Not on file  . Stress: Very much  Relationships  . Social connections:    Talks on phone: Not on file    Gets together: Not on file    Attends religious service: Not on file    Active member of club or organization: Not on file    Attends meetings of clubs or organizations: Not on file    Relationship status: Not on file  . Intimate partner violence:    Fear of current or ex partner: Not on file    Emotionally abused: Not on file    Physically abused: Not on file    Forced sexual activity: Not on file  Other Topics Concern  . Not on file  Social History Narrative  . Not on file    Outpatient Encounter Medications as of 04/28/2018  Medication Sig  . acetaminophen (TYLENOL) 500 MG tablet Take 1 tablet (500MG ) by mouth every night at bedtime as scheduled and 1 tablet every 6 hours as needed for pain  . aluminum hydroxide-magnesium carbonate (GAVISCON) 95-358 MG/15ML SUSP Take 15 mLs by mouth every evening. For heartburn  . aspirin 81 MG tablet  Take 81 mg by mouth daily.  Marland Kitchen atorvastatin (LIPITOR) 20 MG tablet Take 1 tablet (20 mg total) by mouth daily.  . carbidopa-levodopa (SINEMET IR) 25-100 MG tablet TAKE 1.5 TABLETS BY MOUTH 3 TIMES DAILY FOR PARKINSON'S  . dextromethorphan-guaiFENesin (MUCINEX DM) 30-600 MG 12hr tablet Take 1 tablet by mouth 2 (two) times daily as needed for cough.  . docusate sodium (COLACE) 100 MG capsule Take 100 mg by mouth every other day.  . finasteride (PROSCAR) 5 MG tablet Take 5 mg by mouth daily.   . ondansetron (ZOFRAN) 4 MG tablet Take 4 mg by mouth every 8 (eight) hours as needed for nausea or vomiting.  . pantoprazole (PROTONIX) 40 MG tablet Take 40 mg by mouth daily.  . salicyclic acid-sulfur (SEBULEX) 2-2 % shampoo Apply topically twice weekly for seborrheic dermatitis  . sertraline (ZOLOFT) 100 MG tablet Take 100 mg by mouth  daily.   . Skin Protectants, Misc. (BAZA PROTECT EX) Apply topically 3 (three) times daily.  . tamsulosin (FLOMAX) 0.4 MG CAPS capsule Take 0.4 mg by mouth daily.   . [DISCONTINUED] lansoprazole (PREVACID) 30 MG capsule TAKE 1 CAPSULE BY MOUTH 2 TIMES PER DAY FOR REFLUX (Patient not taking: Reported on 04/19/2018)   No facility-administered encounter medications on file as of 04/28/2018.     Allergies  Allergen Reactions  . Celecoxib Other (See Comments)    Other Reaction: GI Upset, gastritis    Review of Systems  Constitutional: Negative for fever.  HENT: Negative.   Eyes: Negative.   Respiratory: Negative for cough, shortness of breath and wheezing.   Cardiovascular: Negative for chest pain, orthopnea and leg swelling.  Gastrointestinal: Negative.   Skin: Negative.   Neurological: Negative.   Endo/Heme/Allergies: Negative.   Psychiatric/Behavioral: Negative.     Objective:  BP (!) 100/50 (BP Location: Right Arm, Patient Position: Sitting, Cuff Size: Normal)   Pulse 68   Temp 97.9 F (36.6 C) (Oral)   Resp 16   Wt 140 lb (63.5 kg)   BMI 18.99 kg/m   Physical Exam  Constitutional: He is oriented to person, place, and time and well-developed, well-nourished, and in no distress.  HENT:  Head: Normocephalic and atraumatic.  Right Ear: External ear normal.  Left Ear: External ear normal.  Nose: Nose normal.  Abrasion on left side of forehead.  Cardiovascular: Normal rate, regular rhythm and normal heart sounds.  Pulmonary/Chest: Effort normal and breath sounds normal.  Abdominal: Soft.  Musculoskeletal: He exhibits no edema.  Neurological: He is alert and oriented to person, place, and time.  Pt in wheelchair--Parkinsons stigmata.  Skin: Skin is warm and dry.  Stage I sacral decub.  Psychiatric: Mood, memory, affect and judgment normal.    Assessment and Plan :  Progressive Parkinsons Disease Pt/daughter aware of chronic and progressive nature of disease. Fall  risk Discussed in detail with daughter. More than 50% of 25 minute visit spen in counseling. CAD All risk factors treated. Stage I sacral Decub Insomnia Pt using prn Melatonin.  I have done the exam and reviewed the chart and it is accurate to the best of my knowledge. Dentist has been used and  any errors in dictation or transcription are unintentional. Julieanne Manson M.D. [facility]

## 2018-05-05 ENCOUNTER — Telehealth: Payer: Self-pay

## 2018-05-05 NOTE — Telephone Encounter (Signed)
Steven Roach with [facility] is requesting patient's last OV note be faxed to [phone]

## 2018-05-06 NOTE — Telephone Encounter (Signed)
Kesia with [facility] called back today to check the status of last OV note being faxed to them. She states they need the note ASAP for billing purposes.

## 2018-05-06 NOTE — Telephone Encounter (Signed)
This has been printed and faxed

## 2018-05-20 ENCOUNTER — Emergency Department: Payer: Medicare Other

## 2018-05-20 ENCOUNTER — Inpatient Hospital Stay
Admission: EM | Admit: 2018-05-20 | Discharge: 2018-05-24 | DRG: 482 | Disposition: A | Discharge status: Skilled Nursing Facility | Payer: Medicare Other | Source: Skilled Nursing Facility | Attending: Family Medicine | Admitting: Family Medicine

## 2018-05-20 ENCOUNTER — Encounter: Payer: Self-pay | Admitting: Emergency Medicine

## 2018-05-20 ENCOUNTER — Other Ambulatory Visit: Payer: Self-pay

## 2018-05-20 DIAGNOSIS — Z888 Allergy status to other drugs, medicaments and biological substances status: Secondary | ICD-10-CM | POA: Diagnosis not present

## 2018-05-20 DIAGNOSIS — Z79899 Other long term (current) drug therapy: Secondary | ICD-10-CM | POA: Diagnosis not present

## 2018-05-20 DIAGNOSIS — I10 Essential (primary) hypertension: Secondary | ICD-10-CM | POA: Diagnosis present

## 2018-05-20 DIAGNOSIS — R531 Weakness: Secondary | ICD-10-CM

## 2018-05-20 DIAGNOSIS — R410 Disorientation, unspecified: Secondary | ICD-10-CM | POA: Diagnosis not present

## 2018-05-20 DIAGNOSIS — I2510 Atherosclerotic heart disease of native coronary artery without angina pectoris: Secondary | ICD-10-CM | POA: Diagnosis present

## 2018-05-20 DIAGNOSIS — Z7982 Long term (current) use of aspirin: Secondary | ICD-10-CM

## 2018-05-20 DIAGNOSIS — E785 Hyperlipidemia, unspecified: Secondary | ICD-10-CM | POA: Diagnosis present

## 2018-05-20 DIAGNOSIS — N400 Enlarged prostate without lower urinary tract symptoms: Secondary | ICD-10-CM | POA: Diagnosis present

## 2018-05-20 DIAGNOSIS — R451 Restlessness and agitation: Secondary | ICD-10-CM | POA: Diagnosis not present

## 2018-05-20 DIAGNOSIS — Z9841 Cataract extraction status, right eye: Secondary | ICD-10-CM | POA: Diagnosis not present

## 2018-05-20 DIAGNOSIS — K219 Gastro-esophageal reflux disease without esophagitis: Secondary | ICD-10-CM | POA: Diagnosis present

## 2018-05-20 DIAGNOSIS — W1830XA Fall on same level, unspecified, initial encounter: Secondary | ICD-10-CM | POA: Diagnosis present

## 2018-05-20 DIAGNOSIS — Z7189 Other specified counseling: Secondary | ICD-10-CM | POA: Diagnosis not present

## 2018-05-20 DIAGNOSIS — F0280 Dementia in other diseases classified elsewhere without behavioral disturbance: Secondary | ICD-10-CM | POA: Diagnosis present

## 2018-05-20 DIAGNOSIS — Z87891 Personal history of nicotine dependence: Secondary | ICD-10-CM | POA: Diagnosis not present

## 2018-05-20 DIAGNOSIS — Z419 Encounter for procedure for purposes other than remedying health state, unspecified: Secondary | ICD-10-CM

## 2018-05-20 DIAGNOSIS — Z951 Presence of aortocoronary bypass graft: Secondary | ICD-10-CM | POA: Diagnosis not present

## 2018-05-20 DIAGNOSIS — Z66 Do not resuscitate: Secondary | ICD-10-CM | POA: Diagnosis present

## 2018-05-20 DIAGNOSIS — S72009A Fracture of unspecified part of neck of unspecified femur, initial encounter for closed fracture: Secondary | ICD-10-CM | POA: Diagnosis present

## 2018-05-20 DIAGNOSIS — Z803 Family history of malignant neoplasm of breast: Secondary | ICD-10-CM

## 2018-05-20 DIAGNOSIS — Z8711 Personal history of peptic ulcer disease: Secondary | ICD-10-CM

## 2018-05-20 DIAGNOSIS — S72001A Fracture of unspecified part of neck of right femur, initial encounter for closed fracture: Secondary | ICD-10-CM | POA: Diagnosis not present

## 2018-05-20 DIAGNOSIS — Z79890 Hormone replacement therapy (postmenopausal): Secondary | ICD-10-CM | POA: Diagnosis not present

## 2018-05-20 DIAGNOSIS — Z833 Family history of diabetes mellitus: Secondary | ICD-10-CM | POA: Diagnosis not present

## 2018-05-20 DIAGNOSIS — G20 Parkinson's disease: Secondary | ICD-10-CM | POA: Diagnosis present

## 2018-05-20 DIAGNOSIS — S72141A Displaced intertrochanteric fracture of right femur, initial encounter for closed fracture: Secondary | ICD-10-CM | POA: Diagnosis present

## 2018-05-20 DIAGNOSIS — F411 Generalized anxiety disorder: Secondary | ICD-10-CM | POA: Diagnosis present

## 2018-05-20 DIAGNOSIS — Z9842 Cataract extraction status, left eye: Secondary | ICD-10-CM | POA: Diagnosis not present

## 2018-05-20 DIAGNOSIS — Z515 Encounter for palliative care: Secondary | ICD-10-CM | POA: Diagnosis not present

## 2018-05-20 DIAGNOSIS — L8990 Pressure ulcer of unspecified site, unspecified stage: Secondary | ICD-10-CM

## 2018-05-20 DIAGNOSIS — L219 Seborrheic dermatitis, unspecified: Secondary | ICD-10-CM | POA: Diagnosis present

## 2018-05-20 DIAGNOSIS — Z8249 Family history of ischemic heart disease and other diseases of the circulatory system: Secondary | ICD-10-CM | POA: Diagnosis not present

## 2018-05-20 DIAGNOSIS — F329 Major depressive disorder, single episode, unspecified: Secondary | ICD-10-CM | POA: Diagnosis present

## 2018-05-20 LAB — URINALYSIS, COMPLETE (UACMP) WITH MICROSCOPIC
Bacteria, UA: NONE SEEN
Bilirubin Urine: NEGATIVE
Glucose, UA: NEGATIVE mg/dL
HGB URINE DIPSTICK: NEGATIVE
Ketones, ur: 5 mg/dL — AB
LEUKOCYTES UA: NEGATIVE
Nitrite: NEGATIVE
PROTEIN: NEGATIVE mg/dL
SPECIFIC GRAVITY, URINE: 1.024 (ref 1.005–1.030)
pH: 5 (ref 5.0–8.0)

## 2018-05-20 LAB — CBC
HCT: 42.2 % (ref 40.0–52.0)
Hemoglobin: 14.3 g/dL (ref 13.0–18.0)
MCH: 31.5 pg (ref 26.0–34.0)
MCHC: 33.9 g/dL (ref 32.0–36.0)
MCV: 93.1 fL (ref 80.0–100.0)
PLATELETS: 210 10*3/uL (ref 150–440)
RBC: 4.53 MIL/uL (ref 4.40–5.90)
RDW: 14 % (ref 11.5–14.5)
WBC: 8.9 10*3/uL (ref 3.8–10.6)

## 2018-05-20 LAB — PROTIME-INR
INR: 1.05
PROTHROMBIN TIME: 13.6 s (ref 11.4–15.2)

## 2018-05-20 LAB — BASIC METABOLIC PANEL
ANION GAP: 8 (ref 5–15)
BUN: 19 mg/dL (ref 8–23)
CALCIUM: 9 mg/dL (ref 8.9–10.3)
CO2: 29 mmol/L (ref 22–32)
Chloride: 104 mmol/L (ref 98–111)
Creatinine, Ser: 0.83 mg/dL (ref 0.61–1.24)
GFR calc Af Amer: >60 mL/min (ref >60)
GFR calc non Af Amer: >60 mL/min (ref >60)
GLUCOSE: 124 mg/dL — AB (ref 70–99)
POTASSIUM: 4.3 mmol/L (ref 3.5–5.1)
Sodium: 141 mmol/L (ref 135–145)

## 2018-05-20 LAB — APTT: APTT: 27 s (ref 24–36)

## 2018-05-20 LAB — SURGICAL PCR SCREEN
MRSA, PCR: NEGATIVE
Staphylococcus aureus: NEGATIVE

## 2018-05-20 LAB — TYPE AND SCREEN
ABO/RH(D): O POS
Antibody Screen: NEGATIVE

## 2018-05-20 MED ORDER — BAZA PROTECT EX CREA: TOPICAL_CREAM | Freq: Three times a day (TID) | CUTANEOUS | Status: DC

## 2018-05-20 MED ORDER — TAMSULOSIN HCL 0.4 MG PO CAPS
0.4000 mg | ORAL_CAPSULE | Freq: Every day | ORAL | Status: DC
  Administered 2018-05-22 – 2018-05-24 (×3): 0.4 mg via ORAL
  Filled 2018-05-20 (×3): qty 1

## 2018-05-20 MED ORDER — FAMOTIDINE IN NACL 20-0.9 MG/50ML-% IV SOLN
20.0000 mg | Freq: Two times a day (BID) | INTRAVENOUS | Status: DC
  Administered 2018-05-20 – 2018-05-21 (×3): 20 mg via INTRAVENOUS
  Filled 2018-05-20 (×3): qty 50

## 2018-05-20 MED ORDER — ACETAMINOPHEN 650 MG RE SUPP: 650.0000 mg | Freq: Four times a day (QID) | RECTAL | Status: DC | PRN

## 2018-05-20 MED ORDER — ONDANSETRON HCL 4 MG PO TABS: 4.0000 mg | ORAL_TABLET | Freq: Four times a day (QID) | ORAL | Status: DC | PRN

## 2018-05-20 MED ORDER — ONDANSETRON HCL 4 MG/2ML IJ SOLN: 4.0000 mg | Freq: Four times a day (QID) | INTRAMUSCULAR | Status: DC | PRN

## 2018-05-20 MED ORDER — HYDROCODONE-ACETAMINOPHEN 5-325 MG PO TABS: 1.0000 | ORAL_TABLET | ORAL | Status: DC | PRN

## 2018-05-20 MED ORDER — CARBIDOPA-LEVODOPA 25-100 MG PO TABS
1.5000 | ORAL_TABLET | Freq: Three times a day (TID) | ORAL | Status: DC
  Administered 2018-05-20 – 2018-05-24 (×7): 1.5 via ORAL
  Filled 2018-05-20 (×13): qty 1.5

## 2018-05-20 MED ORDER — ATORVASTATIN CALCIUM 20 MG PO TABS
20.0000 mg | ORAL_TABLET | Freq: Every day | ORAL | Status: DC
  Administered 2018-05-22 – 2018-05-24 (×3): 20 mg via ORAL
  Filled 2018-05-20 (×3): qty 1

## 2018-05-20 MED ORDER — MELATONIN 5 MG PO TABS
5.0000 mg | ORAL_TABLET | Freq: Every day | ORAL | Status: DC
  Administered 2018-05-20 – 2018-05-23 (×3): 5 mg via ORAL
  Filled 2018-05-20 (×5): qty 1

## 2018-05-20 MED ORDER — HEPARIN SODIUM (PORCINE) 5000 UNIT/ML IJ SOLN: 5000.0000 [IU] | Freq: Three times a day (TID) | INTRAMUSCULAR | Status: DC

## 2018-05-20 MED ORDER — FINASTERIDE 5 MG PO TABS
5.0000 mg | ORAL_TABLET | Freq: Every day | ORAL | Status: DC
  Administered 2018-05-22 – 2018-05-24 (×3): 5 mg via ORAL
  Filled 2018-05-20 (×3): qty 1

## 2018-05-20 MED ORDER — ONDANSETRON HCL 4 MG PO TABS: 4.0000 mg | ORAL_TABLET | Freq: Three times a day (TID) | ORAL | Status: DC | PRN

## 2018-05-20 MED ORDER — SODIUM CHLORIDE 0.9% FLUSH
3.0000 mL | Freq: Two times a day (BID) | INTRAVENOUS | Status: DC
  Administered 2018-05-23 (×2): 3 mL via INTRAVENOUS

## 2018-05-20 MED ORDER — DOCUSATE SODIUM 100 MG PO CAPS: 100.0000 mg | ORAL_CAPSULE | ORAL | Status: DC

## 2018-05-20 MED ORDER — ACETAMINOPHEN 325 MG PO TABS
650.0000 mg | ORAL_TABLET | Freq: Four times a day (QID) | ORAL | Status: DC | PRN
  Administered 2018-05-20: 650 mg via ORAL
  Filled 2018-05-20: qty 2

## 2018-05-20 MED ORDER — ZINC OXIDE 40 % EX OINT
TOPICAL_OINTMENT | Freq: Three times a day (TID) | CUTANEOUS | Status: DC
  Administered 2018-05-22 – 2018-05-23 (×4): via TOPICAL
  Filled 2018-05-20 (×3): qty 113

## 2018-05-20 MED ORDER — SERTRALINE HCL 50 MG PO TABS
100.0000 mg | ORAL_TABLET | Freq: Every day | ORAL | Status: DC
  Administered 2018-05-22 – 2018-05-24 (×3): 100 mg via ORAL
  Filled 2018-05-20 (×3): qty 2

## 2018-05-20 MED ORDER — MORPHINE SULFATE (PF) 2 MG/ML IV SOLN
2.0000 mg | INTRAVENOUS | Status: DC | PRN
Start: 2018-05-20 — End: 2018-05-21

## 2018-05-20 NOTE — H&P (Signed)
[employer] [facility]   PATIENT NAME: Steven Roach    MR#:  [id number]  DATE OF BIRTH:  1929-10-01  DATE OF ADMISSION:  05/20/2018  PRIMARY CARE PHYSICIAN: Maple Hudson., MD   REQUESTING/REFERRING PHYSICIAN:   CHIEF COMPLAINT:   Chief Complaint  Patient presents with  . Fall    HISTORY OF PRESENT ILLNESS: Steven Roach  is a 84 y.o. male with a known history per below status post mechanical fall at extended care facility, ER work-up noted for right trochanteric hip fracture, patient is poor historian due to Parkinson's disease, multiple daughters at the bedside, patient now be admitted for acute right intertrochanteric hip fracture  PAST MEDICAL HISTORY:   Past Medical History:  Diagnosis Date  . GERD (gastroesophageal reflux disease)   . Parkinson's disease (HCC)     PAST SURGICAL HISTORY:  Past Surgical History:  Procedure Laterality Date  . CARDIAC SURGERY     bypass-at [facility]  . CATARACT EXTRACTION, BILATERAL      SOCIAL HISTORY:  Social History   Tobacco Use  . Smoking status: Former Smoker    Packs/day: 1.00    Years: 15.00    Pack years: 15.00    Types: Cigarettes    Last attempt to quit: 10/16/1963    Years since quitting: 54.6  . Smokeless tobacco: Never Used  Substance Use Topics  . Alcohol use: No    FAMILY HISTORY:  Family History  Problem Relation Age of Onset  . Diabetes Mother   . Breast cancer Mother   . Heart attack Sister   . Diabetes Brother     DRUG ALLERGIES:  Allergies  Allergen Reactions  . Celecoxib Other (See Comments)    Other Reaction: GI Upset, gastritis    REVIEW OF SYSTEMS: Unable to be obtained given Parkinson's disease, family at the bedside  CONSTITUTIONAL: No fever, fatigue or weakness.  EYES: No blurred or double vision.  EARS, NOSE, AND THROAT: No tinnitus or ear pain.  RESPIRATORY: No cough, shortness of breath, wheezing or hemoptysis.  CARDIOVASCULAR: No chest  pain, orthopnea, edema.  GASTROINTESTINAL: No nausea, vomiting, diarrhea or abdominal pain.  GENITOURINARY: No dysuria, hematuria.  ENDOCRINE: No polyuria, nocturia,  HEMATOLOGY: No anemia, easy bruising or bleeding SKIN: No rash or lesion. MUSCULOSKELETAL: No joint pain or arthritis.   NEUROLOGIC: No tingling, numbness, weakness.  PSYCHIATRY: No anxiety or depression.   MEDICATIONS AT HOME:  Prior to Admission medications   Medication Sig Start Date End Date Taking? Authorizing Provider  acetaminophen (TYLENOL) 500 MG tablet Take 1 tablet (500MG ) by mouth every night at bedtime as scheduled and 1 tablet every 6 hours as needed for pain   Yes [provider]  aluminum hydroxide-magnesium carbonate (GAVISCON) 95-358 MG/15ML SUSP Take 15 mLs by mouth every evening. For heartburn 03/25/18  Yes Maple Hudson., MD  aspirin 81 MG tablet Take 81 mg by mouth daily.   Yes [provider]  atorvastatin (LIPITOR) 20 MG tablet Take 1 tablet (20 mg total) by mouth daily. 01/17/18  Yes Maple Hudson., MD  carbidopa-levodopa (SINEMET IR) 25-100 MG tablet TAKE 1.5 TABLETS BY MOUTH 3 TIMES DAILY FOR PARKINSON'S 12/26/17  Yes Huston Foley, MD  dextromethorphan-guaiFENesin (MUCINEX DM) 30-600 MG 12hr tablet Take 1 tablet by mouth 2 (two) times daily as needed for cough.   Yes [provider]  docusate sodium (COLACE) 100 MG capsule Take 100 mg by mouth every other day.  Yes [provider]  finasteride (PROSCAR) 5 MG tablet Take 5 mg by mouth daily.  03/22/10  Yes [provider]  Melatonin 3 MG TABS Take 3 mg by mouth every evening.   Yes [provider]  ondansetron (ZOFRAN) 4 MG tablet Take 4 mg by mouth every 8 (eight) hours as needed for nausea or vomiting.   Yes [provider]  pantoprazole (PROTONIX) 40 MG tablet Take 40 mg by mouth 2 (two) times daily.    Yes [provider]  salicyclic acid-sulfur (SEBULEX) 2-2 %  shampoo Apply topically twice weekly for seborrheic dermatitis   Yes [provider]  sertraline (ZOLOFT) 100 MG tablet Take 100 mg by mouth daily.    Yes [provider]  Skin Protectants, Misc. (BAZA PROTECT EX) Apply topically 3 (three) times daily.   Yes [provider]  tamsulosin (FLOMAX) 0.4 MG CAPS capsule Take 0.4 mg by mouth daily.  03/22/10  Yes [provider]      PHYSICAL EXAMINATION:   VITAL SIGNS: Blood pressure (!) 144/71, pulse 80, temperature 98 F (36.7 C), temperature source Oral, resp. rate 18, height 5\' 9"  (1.753 m), weight 65.8 kg (145 lb), SpO2 96 %.  GENERAL:  84 y.o.-year-old patient lying in the bed with no acute distress.  Frail-appearing eYES: Pupils equal, round, reactive to light and accommodation. No scleral icterus. Extraocular muscles intact.  HEENT: Head atraumatic, normocephalic. Oropharynx and nasopharynx clear.  NECK:  Supple, no jugular venous distention. No thyroid enlargement, no tenderness.  LUNGS: Normal breath sounds bilaterally, no wheezing, rales,rhonchi or crepitation. No use of accessory muscles of respiration.  CARDIOVASCULAR: S1, S2 normal. No murmurs, rubs, or gallops.  ABDOMEN: Soft, nontender, nondistended. Bowel sounds present. No organomegaly or mass.  EXTREMITIES: No pedal edema, cyanosis, or clubbing.  NEUROLOGIC: Cranial nerves II through XII are intact. MAES. Gait not checked.  PSYCHIATRIC: The patient is alert, awake, confused and disoriented.  SKIN: No obvious rash, lesion, or ulcer.   LABORATORY PANEL:   CBC Recent Labs  Lab 05/20/18 1803  WBC 8.9  HGB 14.3  HCT 42.2  PLT 210  MCV 93.1  MCH 31.5  MCHC 33.9  RDW 14.0   ------------------------------------------------------------------------------------------------------------------  Chemistries  Recent Labs  Lab 05/20/18 1803  NA 141  K 4.3  CL 104  CO2 29  GLUCOSE 124*  BUN 19  CREATININE 0.83  CALCIUM 9.0    ------------------------------------------------------------------------------------------------------------------ estimated creatinine clearance is 59.5 mL/min (by C-G formula based on SCr of 0.83 mg/dL). ------------------------------------------------------------------------------------------------------------------ No results for input(s): TSH, T4TOTAL, T3FREE, THYROIDAB in the last 72 hours.  Invalid input(s): FREET3   Coagulation profile No results for input(s): INR, PROTIME in the last 168 hours. ------------------------------------------------------------------------------------------------------------------- No results for input(s): DDIMER in the last 72 hours. -------------------------------------------------------------------------------------------------------------------  Cardiac Enzymes No results for input(s): CKMB, TROPONINI, MYOGLOBIN in the last 168 hours.  Invalid input(s): CK ------------------------------------------------------------------------------------------------------------------ Invalid input(s): POCBNP  ---------------------------------------------------------------------------------------------------------------  Urinalysis    Component Value Date/Time   COLORURINE YELLOW (A) 11/29/2015 1814   APPEARANCEUR Cloudy (A) 05/13/2017 1416   LABSPEC 1.018 11/29/2015 1814   LABSPEC 1.013 05/14/2014 2204   PHURINE 7.0 11/29/2015 1814   GLUCOSEU Negative 05/13/2017 1416   GLUCOSEU Negative 05/14/2014 2204   HGBUR NEGATIVE 11/29/2015 1814   BILIRUBINUR Negative 05/13/2017 1416   BILIRUBINUR Negative 05/14/2014 2204   KETONESUR TRACE (A) 11/29/2015 1814   PROTEINUR Trace 05/13/2017 1416   PROTEINUR NEGATIVE 11/29/2015 1814   NITRITE Negative 05/13/2017 1416  NITRITE NEGATIVE 11/29/2015 1814   LEUKOCYTESUR Negative 05/13/2017 1416   LEUKOCYTESUR 3+ 05/14/2014 2204     RADIOLOGY: Dg Chest 1 View  Result Date: 05/20/2018 CLINICAL DATA:  Right hip  pain after fall. EXAM: CHEST  1 VIEW COMPARISON:  Radiographs of November 29, 2015. FINDINGS: Stable cardiomediastinal silhouette. Status post coronary artery bypass graft. Atherosclerosis of thoracic aorta is noted. No pneumothorax is noted. Right lung is clear. Mild left basilar atelectasis is noted with possible small left pleural effusion. Bony thorax is unremarkable. IMPRESSION: Mild left basilar atelectasis is noted with possible small left pleural effusion. Aortic Atherosclerosis (ICD10-I70.0). Electronically Signed   By: Lupita RaiderJames  Green Jr, M.D.   On: 05/20/2018 17:41   Dg Hip Unilat  With Pelvis 2-3 Views Right  Result Date: 05/20/2018 CLINICAL DATA:  Fall and acute right hip pain. EXAM: DG HIP (WITH OR WITHOUT PELVIS) 2-3V RIGHT COMPARISON:  04/19/2018 FINDINGS: Fracture involving the proximal right femur. This is an intertrochanteric fracture with displacement of the lesser trochanter. Pelvic bony ring appears to be intact. No gross abnormality to the left hip. Vascular calcifications in the upper thighs. Lateral view is limited but the right femoral head appears to be located. IMPRESSION: Right femoral intertrochanteric fracture. Electronically Signed   By: Richarda OverlieAdam  Henn M.D.   On: 05/20/2018 17:41    EKG: Orders placed or performed during the hospital encounter of 05/20/18  . ED EKG  . ED EKG  . EKG 12-Lead  . EKG 12-Lead  . EKG 12-Lead  . EKG 12-Lead    IMPRESSION AND PLAN: *Acute right intertrochanteric hip fracture status post mechanical fall *Chronic Parkinson's disease *Chronic GERD without esophagitis *DNR *Chronic stage II sacral decubitus wound  Admit to regular nursing for bed, orthopedic surgery to see, adult pain protocol, n.p.o. after midnight, IV fluids for rehydration, aspiration/fall precautions while in house, PPI IV daily, wound care nurse to evaluate/treat, and continue close medical monitoring   All the records are reviewed and case discussed with ED  provider. Management plans discussed with the patient, family and they are in agreement.  CODE STATUS:dnr Advance Directive Documentation     Most Recent Value  Type of Advance Directive  Out of facility DNR (pink MOST or yellow form), Healthcare Power of Attorney, Living will  Pre-existing out of facility DNR order (yellow form or pink MOST form)  Yellow form placed in chart (order not valid for inpatient use)  "MOST" Form in Place?  -       TOTAL TIME TAKING CARE OF THIS PATIENT: 45 minutes.    Evelena AsaMontell D Shaneal Barasch M.D on 05/20/2018   Between 7am to 6pm - Pager - [phone]  After 6pm go to [email or web address] - password [email or web address] [facility]  [facility]  Office  [phone]  CC: Primary care physician; Maple HudsonGilbert, Richard L Jr., MD   Note: This dictation was prepared with Dragon dictation along with smaller phrase technology. Any transcriptional errors that result from this process are unintentional.

## 2018-05-20 NOTE — ED Notes (Signed)
Butch RN, aware of bed assigned  

## 2018-05-20 NOTE — ED Triage Notes (Signed)
Pt to ED via EMS from [facility] c/o unwitnessed fall, denies LOC, denies hitting head.  Pain to right hip, some rotation and shortening noted.  EMS vitals 144/66 BP, 80 HR, 100% RA, 165 CBG.

## 2018-05-20 NOTE — ED Notes (Signed)
Patient transported to X-ray 

## 2018-05-20 NOTE — Progress Notes (Signed)
Family Meeting Note  Advance Directive:yes  Today a meeting took place with the Patient, multiple daughters.  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

## 2018-05-20 NOTE — ED Provider Notes (Signed)
[facility] Emergency Department Provider Note  Time seen: 5:53 PM  I have reviewed the triage vital signs and the nursing notes.   HISTORY  Chief Complaint Fall    HPI Steven Roach is a 84 y.o. male with a past medical history of gastric reflux, Parkinson's disease, CAD status post CABG, presents to the emergency department after a fall.  According to the patient he got up from his recliner and fell forwards landing on his right side.  Denies hitting his head.  Denies LOC.  Wife confirms.  Patient denies any other injuries.  Does state pain in his right hip, unable to stand or bear weight.  Largely negative review of systems otherwise.  Denies use of anticoagulation.   Past Medical History:  Diagnosis Date  . GERD (gastroesophageal reflux disease)   . Parkinson's disease [facility](HCC)     Patient Active Problem List   Diagnosis Date Noted  . Borderline diabetes 03/06/2017  . GERD without esophagitis 03/06/2017  . Hyperlipidemia 03/06/2017  . Stomach ulcer 03/06/2017  . Depression 03/06/2017  . History of chickenpox 03/06/2017  . History of measles 03/06/2017  . History of mumps 03/06/2017  . Bilateral renal cysts 03/06/2017  . Impotence 03/06/2017  . Hypertension 03/06/2017  . GAD (generalized anxiety disorder) 02/27/2016  . CAD (coronary artery disease) 05/07/2012  . PD (Parkinson's disease) (HCC) 05/07/2012  . S/P CABG (coronary artery bypass graft) 05/07/2012    Past Surgical History:  Procedure Laterality Date  . CARDIAC SURGERY     bypass-at [facility]  . CATARACT EXTRACTION, BILATERAL      Prior to Admission medications   Medication Sig Start Date End Date Taking? Authorizing Provider  acetaminophen (TYLENOL) 500 MG tablet Take 1 tablet (500MG ) by mouth every night at bedtime as scheduled and 1 tablet every 6 hours as needed for pain    [provider]  aluminum hydroxide-magnesium carbonate (GAVISCON) 95-358 MG/15ML SUSP Take 15  mLs by mouth every evening. For heartburn 03/25/18   Maple HudsonGilbert, Richard L Jr., MD  aspirin 81 MG tablet Take 81 mg by mouth daily.    [provider]  atorvastatin (LIPITOR) 20 MG tablet Take 1 tablet (20 mg total) by mouth daily. 01/17/18   Maple HudsonGilbert, Richard L Jr., MD  carbidopa-levodopa (SINEMET IR) 25-100 MG tablet TAKE 1.5 TABLETS BY MOUTH 3 TIMES DAILY FOR PARKINSON'S 12/26/17   Huston FoleyAthar, Saima, MD  dextromethorphan-guaiFENesin [facility](MUCINEX DM) 30-600 MG 12hr tablet Take 1 tablet by mouth 2 (two) times daily as needed for cough.    [provider]  docusate sodium (COLACE) 100 MG capsule Take 100 mg by mouth every other day.    [provider]  finasteride (PROSCAR) 5 MG tablet Take 5 mg by mouth daily.  03/22/10   [provider]  ondansetron (ZOFRAN) 4 MG tablet Take 4 mg by mouth every 8 (eight) hours as needed for nausea or vomiting.    [provider]  pantoprazole (PROTONIX) 40 MG tablet Take 40 mg by mouth daily.    [provider]  salicyclic acid-sulfur (SEBULEX) 2-2 % shampoo Apply topically twice weekly for seborrheic dermatitis    [provider]  sertraline (ZOLOFT) 100 MG tablet Take 100 mg by mouth daily.     [provider]  Skin Protectants, Misc. (BAZA PROTECT EX) Apply topically 3 (three) times daily.    [provider]  tamsulosin (FLOMAX) 0.4 MG CAPS capsule Take 0.4 mg by mouth daily.  03/22/10  [provider]    Allergies  Allergen Reactions  . Celecoxib Other (See Comments)    Other Reaction: GI Upset, gastritis    Family History  Problem Relation Age of Onset  . Diabetes Mother   . Breast cancer Mother   . Heart attack Sister   . Diabetes Brother     Social History Social History   Tobacco Use  . Smoking status: Former Smoker    Packs/day: 1.00    Years: 15.00    Pack years: 15.00    Types: Cigarettes    Last attempt to quit: 10/16/1963    Years since quitting: 54.6  .  Smokeless tobacco: Never Used  Substance Use Topics  . Alcohol use: No  . Drug use: No    Review of Systems Constitutional: Negative for loss of consciousness Cardiovascular: Negative for chest pain. Respiratory: Negative for shortness of breath. Gastrointestinal: Negative for abdominal pain Genitourinary: Negative for urinary compaints Musculoskeletal: Positive for right hip pain, aching pain, worse with movement Skin: Negative for skin complaints  Neurological: Negative for headache or head injury. All other ROS negative  ____________________________________________   PHYSICAL EXAM:  VITAL SIGNS: ED Triage Vitals  Enc Vitals Group     BP 05/20/18 1653 (!) 155/62     Pulse Rate 05/20/18 1653 78     Resp 05/20/18 1653 14     Temp 05/20/18 1653 98 F (36.7 C)     Temp Source 05/20/18 1653 Oral     SpO2 05/20/18 1653 98 %     Weight 05/20/18 1654 145 lb (65.8 kg)     Height 05/20/18 1654 5\' 9"  (1.753 m)     Head Circumference --      Peak Flow --      Pain Score 05/20/18 1654 5     Pain Loc --      Pain Edu? --      Excl. in GC? --    Constitutional: Alert and oriented. Well appearing and in no distress. Eyes: Normal exam ENT   Head: Normocephalic and atraumatic.   Mouth/Throat: Mucous membranes are moist. Cardiovascular: Normal rate, regular rhythm. No murmur Respiratory: Normal respiratory effort without tachypnea nor retractions. Breath sounds are clear  Gastrointestinal: Soft and nontender. No distention.   Musculoskeletal: Moderate tenderness in right hip, neurovascular intact distally. Neurologic:  Normal speech and language. No gross focal neurologic deficits Skin:  Skin is warm, dry and intact.  Psychiatric: Mood and affect are normal.   ____________________________________________    EKG  EKG reviewed and interpreted by myself shows sinus rhythm at 71 bpm with a narrow QRS, normal axis, normal intervals, no concerning ST changes occasional  PVC.  ____________________________________________    RADIOLOGY  X-ray shows right intertrochanteric fracture. Chest x-ray shows atelectasis  ____________________________________________   INITIAL IMPRESSION / ASSESSMENT AND PLAN / ED COURSE  Pertinent labs & imaging results that were available during my care of the patient were reviewed by me and considered in my medical decision making (see chart for details).  Patient presents to the emergency department after a fall.  Patient has tenderness to the right hip, differential would include contusion, fracture, dislocation.  X-ray confirms intertrochanteric fracture of the right hip.  We will discuss with orthopedics, specifically requesting Dr. Ernest Pine if available.  We will discuss with internal medicine for admission to the hospital.  Awaiting labs for medical clearance.  Patient does have VA benefits but specifically requests to stay here and not be transferred  to the [place].    Labs are largely negative.  Patient will be admitted to the hospitalist service.  ____________________________________________   FINAL CLINICAL IMPRESSION(S) / ED DIAGNOSES  Right intertrochanteric hip fracture    Minna Antis, MD 05/20/18 1845

## 2018-05-20 NOTE — ED Notes (Signed)
Pt given sandwich tray, Sprite, and vanilla ice cream at this time. Pt and family verbalize understanding that pt will be NPO at midnight.

## 2018-05-21 ENCOUNTER — Other Ambulatory Visit: Payer: Self-pay

## 2018-05-21 ENCOUNTER — Inpatient Hospital Stay: Payer: Medicare Other | Admitting: Anesthesiology

## 2018-05-21 ENCOUNTER — Encounter: Payer: Self-pay | Admitting: Anesthesiology

## 2018-05-21 ENCOUNTER — Inpatient Hospital Stay: Payer: Medicare Other

## 2018-05-21 ENCOUNTER — Encounter
Admission: EM | Disposition: A | Discharge status: Skilled Nursing Facility | Payer: Self-pay | Source: Skilled Nursing Facility | Attending: Internal Medicine

## 2018-05-21 HISTORY — PX: INTRAMEDULLARY (IM) NAIL INTERTROCHANTERIC: SHX5875

## 2018-05-21 LAB — CBC
HCT: 34.9 % — ABNORMAL LOW (ref 40.0–52.0)
HEMOGLOBIN: 11.8 g/dL — AB (ref 13.0–18.0)
MCH: 31 pg (ref 26.0–34.0)
MCHC: 33.7 g/dL (ref 32.0–36.0)
MCV: 91.9 fL (ref 80.0–100.0)
PLATELETS: 207 10*3/uL (ref 150–440)
RBC: 3.8 MIL/uL — ABNORMAL LOW (ref 4.40–5.90)
RDW: 13.6 % (ref 11.5–14.5)
WBC: 11.1 10*3/uL — ABNORMAL HIGH (ref 3.8–10.6)

## 2018-05-21 SURGERY — FIXATION, FRACTURE, INTERTROCHANTERIC, WITH INTRAMEDULLARY ROD
Anesthesia: Spinal | Site: Hip | Laterality: Right | Wound class: Clean

## 2018-05-21 MED ORDER — KETAMINE HCL 50 MG/ML IJ SOLN
INTRAMUSCULAR | Status: AC
  Filled 2018-05-21: qty 10

## 2018-05-21 MED ORDER — SODIUM CHLORIDE FLUSH 0.9 % IV SOLN
INTRAVENOUS | Status: AC
  Filled 2018-05-21: qty 10

## 2018-05-21 MED ORDER — PROPOFOL 500 MG/50ML IV EMUL
INTRAVENOUS | Status: DC | PRN
  Administered 2018-05-21: 25 ug/kg/min via INTRAVENOUS

## 2018-05-21 MED ORDER — HYDROCODONE-ACETAMINOPHEN 5-325 MG PO TABS
1.0000 | ORAL_TABLET | ORAL | Status: DC | PRN
  Administered 2018-05-22: 1 via ORAL
  Filled 2018-05-21 (×2): qty 1

## 2018-05-21 MED ORDER — METHOCARBAMOL 500 MG PO TABS
500.0000 mg | ORAL_TABLET | Freq: Four times a day (QID) | ORAL | Status: DC | PRN
  Administered 2018-05-22: 500 mg via ORAL
  Filled 2018-05-21 (×2): qty 1

## 2018-05-21 MED ORDER — FENTANYL CITRATE (PF) 100 MCG/2ML IJ SOLN
INTRAMUSCULAR | Status: DC | PRN
  Administered 2018-05-21: 50 ug via INTRAVENOUS

## 2018-05-21 MED ORDER — DOCUSATE SODIUM 100 MG PO CAPS
100.0000 mg | ORAL_CAPSULE | Freq: Two times a day (BID) | ORAL | Status: DC
  Administered 2018-05-21 – 2018-05-24 (×5): 100 mg via ORAL
  Filled 2018-05-21 (×5): qty 1

## 2018-05-21 MED ORDER — ONDANSETRON HCL 4 MG/2ML IJ SOLN: 4.0000 mg | Freq: Once | INTRAMUSCULAR | Status: DC | PRN

## 2018-05-21 MED ORDER — BISACODYL 10 MG RE SUPP: 10.0000 mg | Freq: Every day | RECTAL | Status: DC | PRN

## 2018-05-21 MED ORDER — DEXAMETHASONE SODIUM PHOSPHATE 10 MG/ML IJ SOLN
INTRAMUSCULAR | Status: DC | PRN
  Administered 2018-05-21: 5 mg via INTRAVENOUS

## 2018-05-21 MED ORDER — SODIUM CHLORIDE 0.9 % IR SOLN
Status: DC | PRN
  Administered 2018-05-21: 1000 mL via SURGICAL_CAVITY

## 2018-05-21 MED ORDER — BUPIVACAINE HCL (PF) 0.5 % IJ SOLN
INTRAMUSCULAR | Status: DC | PRN
  Administered 2018-05-21: 3 mL via INTRATHECAL

## 2018-05-21 MED ORDER — METHOCARBAMOL 1000 MG/10ML IJ SOLN
500.0000 mg | Freq: Four times a day (QID) | INTRAVENOUS | Status: DC | PRN
  Filled 2018-05-21: qty 5

## 2018-05-21 MED ORDER — KETAMINE HCL 50 MG/ML IJ SOLN
INTRAMUSCULAR | Status: DC | PRN
  Administered 2018-05-21: 25 mg via INTRAVENOUS

## 2018-05-21 MED ORDER — TRAMADOL HCL 50 MG PO TABS
50.0000 mg | ORAL_TABLET | Freq: Four times a day (QID) | ORAL | Status: DC
  Administered 2018-05-22 – 2018-05-24 (×6): 50 mg via ORAL
  Filled 2018-05-21 (×7): qty 1

## 2018-05-21 MED ORDER — CEFAZOLIN SODIUM 1 G IJ SOLR
INTRAMUSCULAR | Status: AC
  Filled 2018-05-21: qty 20

## 2018-05-21 MED ORDER — PROPOFOL 10 MG/ML IV BOLUS
INTRAVENOUS | Status: AC
  Filled 2018-05-21: qty 40

## 2018-05-21 MED ORDER — CEFAZOLIN SODIUM-DEXTROSE 1-4 GM/50ML-% IV SOLN
INTRAVENOUS | Status: DC | PRN
  Administered 2018-05-21: 2 g via INTRAVENOUS

## 2018-05-21 MED ORDER — MORPHINE SULFATE (PF) 2 MG/ML IV SOLN: 0.5000 mg | INTRAVENOUS | Status: DC | PRN

## 2018-05-21 MED ORDER — EPINEPHRINE PF 1 MG/ML IJ SOLN
INTRAMUSCULAR | Status: AC
  Filled 2018-05-21: qty 1

## 2018-05-21 MED ORDER — EPINEPHRINE PF 1 MG/ML IJ SOLN
INTRAMUSCULAR | Status: DC | PRN
  Administered 2018-05-21: .0001 mL via INTRATHECAL

## 2018-05-21 MED ORDER — PHENYLEPHRINE HCL 10 MG/ML IJ SOLN
INTRAMUSCULAR | Status: DC | PRN
  Administered 2018-05-21: 40 ug/min via INTRAVENOUS

## 2018-05-21 MED ORDER — METOCLOPRAMIDE HCL 5 MG/ML IJ SOLN: 5.0000 mg | Freq: Three times a day (TID) | INTRAMUSCULAR | Status: DC | PRN

## 2018-05-21 MED ORDER — PHENYLEPHRINE HCL 10 MG/ML IJ SOLN
INTRAMUSCULAR | Status: AC
  Filled 2018-05-21: qty 1

## 2018-05-21 MED ORDER — FENTANYL CITRATE (PF) 100 MCG/2ML IJ SOLN: 25.0000 ug | INTRAMUSCULAR | Status: DC | PRN

## 2018-05-21 MED ORDER — METOCLOPRAMIDE HCL 10 MG PO TABS: 5.0000 mg | ORAL_TABLET | Freq: Three times a day (TID) | ORAL | Status: DC | PRN

## 2018-05-21 MED ORDER — ALUM & MAG HYDROXIDE-SIMETH 200-200-20 MG/5ML PO SUSP: 30.0000 mL | ORAL | Status: DC | PRN

## 2018-05-21 MED ORDER — POTASSIUM CHLORIDE IN NACL 20-0.9 MEQ/L-% IV SOLN
INTRAVENOUS | Status: DC
  Administered 2018-05-21 – 2018-05-22 (×3): via INTRAVENOUS
  Filled 2018-05-21 (×7): qty 1000

## 2018-05-21 MED ORDER — ACETAMINOPHEN 325 MG PO TABS
325.0000 mg | ORAL_TABLET | Freq: Four times a day (QID) | ORAL | Status: DC | PRN
  Administered 2018-05-23: 650 mg via ORAL
  Filled 2018-05-21: qty 2

## 2018-05-21 MED ORDER — CEFAZOLIN SODIUM-DEXTROSE 1-4 GM/50ML-% IV SOLN
1.0000 g | Freq: Four times a day (QID) | INTRAVENOUS | Status: AC
  Administered 2018-05-21 – 2018-05-22 (×2): 1 g via INTRAVENOUS
  Filled 2018-05-21 (×2): qty 50

## 2018-05-21 MED ORDER — DEXMEDETOMIDINE HCL 200 MCG/2ML IV SOLN
INTRAVENOUS | Status: DC | PRN
  Administered 2018-05-21: 4 ug via INTRAVENOUS

## 2018-05-21 MED ORDER — ACETAMINOPHEN 500 MG PO TABS
500.0000 mg | ORAL_TABLET | Freq: Four times a day (QID) | ORAL | Status: AC
  Administered 2018-05-21 – 2018-05-22 (×3): 500 mg via ORAL
  Filled 2018-05-21 (×3): qty 1

## 2018-05-21 MED ORDER — PROPOFOL 10 MG/ML IV BOLUS
INTRAVENOUS | Status: DC | PRN
  Administered 2018-05-21: 30 mg via INTRAVENOUS

## 2018-05-21 MED ORDER — LACTATED RINGERS IV SOLN
INTRAVENOUS | Status: DC | PRN
  Administered 2018-05-21: 13:00:00 via INTRAVENOUS

## 2018-05-21 MED ORDER — CEFAZOLIN SODIUM-DEXTROSE 2-4 GM/100ML-% IV SOLN
2.0000 g | Freq: Four times a day (QID) | INTRAVENOUS | Status: DC
  Filled 2018-05-21 (×2): qty 100

## 2018-05-21 MED ORDER — FENTANYL CITRATE (PF) 100 MCG/2ML IJ SOLN
INTRAMUSCULAR | Status: AC
  Filled 2018-05-21: qty 2

## 2018-05-21 MED ORDER — ENOXAPARIN SODIUM 40 MG/0.4ML ~~LOC~~ SOLN
40.0000 mg | SUBCUTANEOUS | Status: DC
  Administered 2018-05-22 – 2018-05-24 (×3): 40 mg via SUBCUTANEOUS
  Filled 2018-05-21 (×3): qty 0.4

## 2018-05-21 MED ORDER — MAGNESIUM CITRATE PO SOLN
1.0000 | Freq: Once | ORAL | Status: DC | PRN
  Filled 2018-05-21: qty 296

## 2018-05-21 MED ORDER — POLYETHYLENE GLYCOL 3350 17 G PO PACK
17.0000 g | PACK | Freq: Every day | ORAL | Status: DC | PRN
  Administered 2018-05-22: 17 g via ORAL
  Filled 2018-05-21: qty 1

## 2018-05-21 SURGICAL SUPPLY — 37 items
BIT DRILL 4.3MMS DISTAL GRDTED (BIT) ×1 IMPLANT
BNDG COHESIVE 6X5 TAN STRL LF (GAUZE/BANDAGES/DRESSINGS) ×6 IMPLANT
CANISTER SUCT 1200ML W/VALVE (MISCELLANEOUS) ×3 IMPLANT
CORTICAL BONE SCR 5.0MM X 48MM (Screw) ×3 IMPLANT
DRAPE SHEET LG 3/4 BI-LAMINATE (DRAPES) ×6 IMPLANT
DRAPE SURG 17X11 SM STRL (DRAPES) ×6 IMPLANT
DRAPE U-SHAPE 47X51 STRL (DRAPES) ×3 IMPLANT
DRILL 4.3MMS DISTAL GRADUATED (BIT) ×3
DRSG OPSITE POSTOP 4X14 (GAUZE/BANDAGES/DRESSINGS) ×3 IMPLANT
DURAPREP 26ML APPLICATOR (WOUND CARE) ×6 IMPLANT
ELECT REM PT RETURN 9FT ADLT (ELECTROSURGICAL) ×3
ELECTRODE REM PT RTRN 9FT ADLT (ELECTROSURGICAL) ×1 IMPLANT
GLOVE BIOGEL PI IND STRL 9 (GLOVE) ×1 IMPLANT
GLOVE BIOGEL PI INDICATOR 9 (GLOVE) ×2
GLOVE SURG 9.0 ORTHO LTXF (GLOVE) ×6 IMPLANT
GOWN STRL REUS TWL 2XL XL LVL4 (GOWN DISPOSABLE) ×3 IMPLANT
GOWN STRL REUS W/ TWL LRG LVL3 (GOWN DISPOSABLE) ×1 IMPLANT
GOWN STRL REUS W/TWL LRG LVL3 (GOWN DISPOSABLE) ×2
GUIDEWIRE BALL NOSE 80CM (WIRE) ×3 IMPLANT
HEMOVAC 400CC 10FR (MISCELLANEOUS) ×3 IMPLANT
HIP FR NAIL LAG SCREW 10.5X110 (Orthopedic Implant) ×3 IMPLANT
KIT TURNOVER CYSTO (KITS) ×3 IMPLANT
MAT BLUE FLOOR 46X72 FLO (MISCELLANEOUS) ×3 IMPLANT
NAIL HIP FRAC RT 130 11MX400M (Nail) ×3 IMPLANT
NS IRRIG 1000ML POUR BTL (IV SOLUTION) ×3 IMPLANT
PACK HIP COMPR (MISCELLANEOUS) ×3 IMPLANT
SCREW BONE CORTICAL 5.0X44 (Screw) ×3 IMPLANT
SCREW CORTICL BON 5.0MM X 48MM (Screw) ×1 IMPLANT
SCREW LAG HIP FR NAIL 10.5X110 (Orthopedic Implant) ×1 IMPLANT
STAPLER SKIN PROX 35W (STAPLE) ×3 IMPLANT
SUCTION FRAZIER HANDLE 10FR (MISCELLANEOUS) ×2
SUCTION TUBE FRAZIER 10FR DISP (MISCELLANEOUS) ×1 IMPLANT
SUT VIC AB 0 CT1 36 (SUTURE) ×6 IMPLANT
SUT VIC AB 2-0 CT1 27 (SUTURE) ×2
SUT VIC AB 2-0 CT1 TAPERPNT 27 (SUTURE) ×1 IMPLANT
SUT VICRYL 0 AB UR-6 (SUTURE) ×3 IMPLANT
SYR 30ML LL (SYRINGE) ×3 IMPLANT

## 2018-05-21 NOTE — Progress Notes (Signed)
Unable to determine spinal level due to dementia

## 2018-05-21 NOTE — Op Note (Signed)
05/21/2018  4:02 PM  PATIENT:  Steven Roach    PRE-OPERATIVE DIAGNOSIS:  RIGHT DISPLACED INTERTROCHANTERIC HIP FRACTURE  POST-OPERATIVE DIAGNOSIS:  Same  PROCEDURE:  INTRAMEDULLARY (IM) NAILING FOR RIGHT INTERTROCHANTRIC HIP FRACTURE  SURGEON:  Juanell FairlyKRASINSKI, Calais Svehla, MD  ANESTHESIA:   Spinal  EBL: 50 cc  IMPLANTS:  Biomet long Affixus nail 11 x 400 mm with 110mm lag screw and 44 and 48mm distal interlocking screws.  PREOPERATIVE INDICATIONS:  Steven Roach is a  84 y.o. male with a diagnosis of RIGHT HIP FRACTURE.  Intramedullary fixation was recommended to allow for early mobilization and reduce pain.    The risks, benefits and alternatives were discussed with the patient and their family.  The risks include but are not limited to infection, bleeding, nerve or blood vessel injury, malunion, nonunion, hardware prominence, hardware failure, change in leg lengths or lower extremity rotation need for further surgery including hardware removal with conversion to a total hip arthroplasty. Medical risks include but are not limited to DVT and pulmonary embolism, myocardial infarction, stroke, pneumonia, respiratory failure and death. The patient's family understood these risks and wished to proceed with surgery.  OPERATIVE PROCEDURE:  The patient was brought to the operating room and placed in the supine position on the fracture table. General anesthesia was administered.  A closed reduction was performed under C-arm guidance.  The fracture reduction was confirmed on both AP and lateral views. A time out was performed to verify the patient's name, date of birth, medical record number, correct site of surgery correct procedure to be performed. The timeout was also used to verify the patient received antibiotics and all appropriate instruments, implants and radiographic studies were available in the room. Once all in attendance were in agreement, the case began. The patient was prepped and  draped in a sterile fashion. He received preoperative antibiotics with 2 grams of Ancef.  An incision was made proximal to the greater trochanter in line with the femur. A guidewire was placed over the tip of the greater trochanter and advanced into the proximal femur to the level of the lesser trochanter.  Confirmation of the drill pin position was made on AP and lateral C-arm images.  The threaded guidepin was then overdrilled with the proximal femoral drill.  A ball-tipped guidewire was then advanced across the fracture and down the femoral shaft to the knee. Its position at both the hip and the knee was confirmed on C-arm imaging. A depth gauge was used to measure the length of the long nail to be used. It was measured to be 400 mm. The nail was then inserted into the proximal femur, across the fracture site and down the femoral shaft. Its position was confirmed on AP and lateral C-arm images.   Once the nail was completely seated, the drill guide for the lag screw was placed through the guide arm for the Affixus nail. A guidepin was then placed through this drill guide and advanced through the lateral cortex of the femur, across the fracture site and into the femoral head achieving a tip apex distance of less than 25 mm. The length of the drill pin was measured to 110 mm, and then the drill for the lag screw was advanced through the lateral cortex, across the fracture site and up into the femoral head to the depth of the lag screw..  The lag screw was then advanced by hand into position across the fracture site into the femoral head. Its final position  was confirmed on AP and lateral C-arm images. Compression was applied as traction was carefully released. The set screw in the top of the intramedullary rod was tightened by hand using a screwdriver. It was backed off a quarter turn to allow for compression at the fracture site.  The attention was then turned to placement of the distal interlocking screws.  A perfect circle technique was used. 2 small stab incisions were made over the distal interlocking screw holes. A free hand technique was used to drill both distal interlocking screws. The depth of the screws was measured with a depth gauge. The appropriate length screw was then advanced into position and tightened by hand. Final C-arm images of the entire intramedullary construct were taken in both the AP and lateral planes.   The wounds were irrigated copiously and closed with 0 Vicryl for closure of the deep fascia and 2-0 Vicryl for subcutaneous closure. The skin was approximated with staples. A dry sterile dressing was applied. I was scrubbed and present the entire case and all sharp and instrument counts were correct at the conclusion of the case. Patient was transferred to hospital bed and brought to PACU in stable condition. Is spoke with the patient's family in the post-op consultation room to let them know the case was performed without complication and the patient was stable in the recovery room.     Kathreen Devoid, MD

## 2018-05-21 NOTE — Anesthesia Procedure Notes (Addendum)
Spinal  Patient location during procedure: OR Start time: 05/21/2018 1:50 PM End time: 05/21/2018 1:59 PM Staffing Anesthesiologist: Martha Clan, MD Resident/CRNA: Rolla Plate, CRNA Other anesthesia staff: Marin Shutter, RN Performed: resident/CRNA and other anesthesia staff  Preanesthetic Checklist Completed: patient identified, site marked, surgical consent, pre-op evaluation, timeout performed, IV checked, risks and benefits discussed and monitors and equipment checked Spinal Block Patient position: left lateral decubitus Prep: ChloraPrep and site prepped and draped Patient monitoring: heart rate, continuous pulse ox, blood pressure and cardiac monitor Approach: midline Location: L4-5 Injection technique: single-shot Needle Needle type: Whitacre and Introducer  Needle gauge: 24 G Needle length: 9 cm Additional Notes Negative paresthesia. Negative blood return. Positive free-flowing CSF. Expiration date of kit checked and confirmed. Patient tolerated procedure well, without complications.

## 2018-05-21 NOTE — Progress Notes (Signed)
Pt arrived back to room 143 from PACU. Pt alert but confused. Surgical dressing clean dry and intact. Family at the bedside. Bed in lowest, call bell in reach and position bed alarm on.

## 2018-05-21 NOTE — Clinical Social Work Note (Addendum)
Clinical Social Work Assessment  Patient Details  Name: Steven Roach MRN: [id number] Date of Birth: 05-21-1929  Date of referral:  05/21/18               Reason for consult:  Facility Placement                Permission sought to share information with:  Chartered certified accountant granted to share information::  Yes, Verbal Permission Granted  Name::      Steven Roach::   Steven Roach   Relationship::     Contact Information:     Housing/Transportation Living arrangements for the past 2 months:  [name] of Information:  Adult Children Patient Interpreter Needed:  None Criminal Activity/Legal Involvement Pertinent to Current Situation/Hospitalization:  No - Comment as needed Significant Relationships:  Adult Children Lives with:  Facility Resident Do you feel safe going back to the place where you live?  Yes Need for family participation in patient care:  Yes (Comment)  Care giving concerns:  Patient is a resident at [name].    Social Worker assessment / plan:  [name] reviewed chart and noted that patient has a hip fracture. Surgery and PT are pending. CSW met with patient and his daughter Steven Roach was at bedside. Patient was asleep and did not participate in assessment. Per Steven Roach, patient's son Steven Roach lives in [place] and is his HPOA. Steven Roach lives in [place]. Per Steven Roach patient has been at [facility] for 2.5 years now. CSW explained that PT will evaluate patient and make a recommendation of home health or SNF. CSW explained that he will likely need to go to SNF for rehab. CSW explained that medicare requires a 3 night qualifying inpatient stay in a hospital in order to pay for SNF. Patient was admitted to inpatient on 05/20/18. Daughter prefers [facility] and is okay with a semi-private room. FL2 complete and faxed out.   CSW presented bed offers to daughter and she chose Steven Roach. Steven Roach  admissions coordinator at [facility] is aware of accepted bed offer. Medicare bundle case manager aware of above. CSW will continue to follow and assist as needed.   Employment status:  Retired Forensic scientist:  Medicare PT Recommendations:  Not assessed at this time Steven Roach / Referral to community resources:  Steven Roach  Patient/Family's Response to care:  Patient's daughter chose [facility].   Patient/Family's Understanding of and Emotional Response to Diagnosis, Current Treatment, and Prognosis:  Patient's daughter was very pleasant and thanked CSW for assistance.   Emotional Assessment Appearance:  Appears stated age Attitude/Demeanor/Rapport:  Unable to Assess Affect (typically observed):  Unable to Assess Orientation:  Oriented to Self, Fluctuating Orientation (Suspected and/or reported Sundowners) Alcohol / Substance use:  Not Applicable Psych involvement (Current and /or in the community):  No (Comment)  Discharge Needs  Concerns to be addressed:  Discharge Planning Concerns Readmission within the last 30 days:  No Current discharge risk:  Dependent with Mobility Barriers to Discharge:  Continued Medical Work up   [employer], Veronia Beets, LCSW 05/21/2018, 11:38 AM

## 2018-05-21 NOTE — Anesthesia Post-op Follow-up Note (Signed)
Anesthesia QCDR form completed.        

## 2018-05-21 NOTE — Consult Note (Signed)
WOC Nurse wound consult note Patient and his family member present in [facility].  Male family member is requesting I defer my assessment of the sacrum until after his surgery today.   Reason for Consult: Sacral wound Plan:  I will order an air mattress for patient's use upon return from surgery.  I will plan to see the area to the sacrum if at all possible, today after his surgery.  If not today, I plan to evaluate tomorrow. Thank you for the consult.  Discussed plan of care with the patient's family member and bedside nurse.  Helmut MusterSherry Ernestine Langworthy, RN, MSN, CWOCN, CNS-BC, pager [phone]

## 2018-05-21 NOTE — Consult Note (Addendum)
ORTHOPAEDIC CONSULTATION  REQUESTING PHYSICIAN: Adrian Saran, MD  Chief Complaint: Right hip pain s/p fall  HPI: Steven Roach is a 84 y.o. male who complains of pain in the right hip.  He has Parkinson's.  He fell trying to get up without assistance yesterday.  Patient's son and daughter are at the bedside.  Past Medical History:  Diagnosis Date  . GERD (gastroesophageal reflux disease)   . Parkinson's disease [facility])    Past Surgical History:  Procedure Laterality Date  . CARDIAC SURGERY     bypass-at [facility]  . CATARACT EXTRACTION, BILATERAL     Social History   Socioeconomic History  . Marital status: Widowed    Spouse name: Not on file  . Number of children: 2  . Years of education: Not on file  . Highest education level: 12th grade  Occupational History  . Not on file  Social Needs  . Financial resource strain: Not hard at all  . Food insecurity:    Worry: Never true    Inability: Never true  . Transportation needs:    Medical: No    Non-medical: No  Tobacco Use  . Smoking status: Former Smoker    Packs/day: 1.00    Years: 15.00    Pack years: 15.00    Types: Cigarettes    Last attempt to quit: 10/16/1963    Years since quitting: 54.6  . Smokeless tobacco: Never Used  Substance and Sexual Activity  . Alcohol use: No  . Drug use: No  . Sexual activity: Never  Lifestyle  . Physical activity:    Days per week: Not on file    Minutes per session: Not on file  . Stress: Very much  Relationships  . Social connections:    Talks on phone: Not on file    Gets together: Not on file    Attends religious service: Not on file    Active member of club or organization: Not on file    Attends meetings of clubs or organizations: Not on file    Relationship status: Not on file  Other Topics Concern  . Not on file  Social History Narrative  . Not on file   Family History  Problem Relation Age of Onset  . Diabetes Mother   . Breast cancer Mother   . Heart  attack Sister   . Diabetes Brother    Allergies  Allergen Reactions  . Celecoxib Other (See Comments)    Other Reaction: GI Upset, gastritis   Prior to Admission medications   Medication Sig Start Date End Date Taking? Authorizing Provider  acetaminophen (TYLENOL) 500 MG tablet Take 1 tablet (500MG ) by mouth every night at bedtime as scheduled and 1 tablet every 6 hours as needed for pain   Yes [provider]  aluminum hydroxide-magnesium carbonate (GAVISCON) 95-358 MG/15ML SUSP Take 15 mLs by mouth every evening. For heartburn 03/25/18  Yes Maple Hudson., MD  aspirin 81 MG tablet Take 81 mg by mouth daily.   Yes [provider]  atorvastatin (LIPITOR) 20 MG tablet Take 1 tablet (20 mg total) by mouth daily. 01/17/18  Yes Maple Hudson., MD  carbidopa-levodopa (SINEMET IR) 25-100 MG tablet TAKE 1.5 TABLETS BY MOUTH 3 TIMES DAILY FOR PARKINSON'S 12/26/17  Yes Huston Foley, MD  dextromethorphan-guaiFENesin (MUCINEX DM) 30-600 MG 12hr tablet Take 1 tablet by mouth 2 (two) times daily as needed for cough.   Yes [provider]  docusate sodium (COLACE)  100 MG capsule Take 100 mg by mouth every other day.   Yes [provider]  finasteride (PROSCAR) 5 MG tablet Take 5 mg by mouth daily.  03/22/10  Yes [provider]  Melatonin 3 MG TABS Take 3 mg by mouth every evening.   Yes [provider]  ondansetron (ZOFRAN) 4 MG tablet Take 4 mg by mouth every 8 (eight) hours as needed for nausea or vomiting.   Yes [provider]  pantoprazole (PROTONIX) 40 MG tablet Take 40 mg by mouth 2 (two) times daily.    Yes [provider]  salicyclic acid-sulfur (SEBULEX) 2-2 % shampoo Apply topically twice weekly for seborrheic dermatitis   Yes [provider]  sertraline (ZOLOFT) 100 MG tablet Take 100 mg by mouth daily.    Yes [provider]  Skin Protectants, Misc. (BAZA PROTECT EX) Apply topically 3 (three)  times daily.   Yes [provider]  tamsulosin (FLOMAX) 0.4 MG CAPS capsule Take 0.4 mg by mouth daily.  03/22/10  Yes [provider]   Dg Chest 1 View  Result Date: 05/20/2018 CLINICAL DATA:  Right hip pain after fall. EXAM: CHEST  1 VIEW COMPARISON:  Radiographs of November 29, 2015. FINDINGS: Stable cardiomediastinal silhouette. Status post coronary artery bypass graft. Atherosclerosis of thoracic aorta is noted. No pneumothorax is noted. Right lung is clear. Mild left basilar atelectasis is noted with possible small left pleural effusion. Bony thorax is unremarkable. IMPRESSION: Mild left basilar atelectasis is noted with possible small left pleural effusion. Aortic Atherosclerosis (ICD10-I70.0). Electronically Signed   By: Lupita RaiderJames  Green Jr, M.D.   On: 05/20/2018 17:41   Dg Hip Unilat  With Pelvis 2-3 Views Right  Result Date: 05/20/2018 CLINICAL DATA:  Fall and acute right hip pain. EXAM: DG HIP (WITH OR WITHOUT PELVIS) 2-3V RIGHT COMPARISON:  04/19/2018 FINDINGS: Fracture involving the proximal right femur. This is an intertrochanteric fracture with displacement of the lesser trochanter. Pelvic bony ring appears to be intact. No gross abnormality to the left hip. Vascular calcifications in the upper thighs. Lateral view is limited but the right femoral head appears to be located. IMPRESSION: Right femoral intertrochanteric fracture. Electronically Signed   By: Richarda OverlieAdam  Henn M.D.   On: 05/20/2018 17:41    Positive ROS: All other systems have been reviewed and were otherwise negative with the exception of those mentioned in the HPI and as above.  Physical Exam: General: Awake, no acute distress   MUSCULOSKELETAL: Right lower extremity: Patient is skin is intact.  He has shortening and external rotation of the right lower extremity.  Patient has palpable pedal pulses and intact sensation light touch and intact motor function.  Assessment: Right intertrochanteric hip fracture,  displaced  Plan: I reviewed the injury with the patient and his family.  I have recommended intramedullary fixation for his right intertrochanteric hip fracture.  We discussed the details of the operation and the postoperative recovery.  I reviewed the surgical risks.  They understand the risks include infection, bleeding requiring blood transfusion, lower extremity burning and external rotation, nerve or blood vessel injury, persistent right hip pain, hardware failure or screw cut out and the need for further surgery.  Medical risks include but are not limited to DVT and pulmonary pleasant, myocardial infarction, stroke, pneumonia, respiratory failure and death.  Since family understood these risks and wished to proceed.  I reviewed the laboratory and radiographic studies in preparation for this case.    Juanell FairlyKRASINSKI, Meshilem Machuca, MD  05/21/2018 12:37 PM

## 2018-05-21 NOTE — Progress Notes (Signed)
Subjective:  POST-OP CHECK Patient sleeping comfortably.  Family at the bedside.    Objective:   VITALS:   Vitals:   05/21/18 1605 05/21/18 1614 05/21/18 1618 05/21/18 1631  BP:  (!) 115/55 (!) 119/56 (!) 116/54  Pulse: 76 69 74 75  Resp: 13 13 13 18   Temp:  99.4 F (37.4 C)  99.1 F (37.3 C)  TempSrc:    Axillary  SpO2: 96% 98% 97% 94%  Weight:      Height:        PHYSICAL EXAM: Deferred due to patient sleeping.   LABS  Results for orders placed or performed during the hospital encounter of 05/20/18 (from the past 24 hour(s))  Urinalysis, Complete w Microscopic     Status: Abnormal   Collection Time: 05/20/18  7:08 PM  Result Value Ref Range   Color, Urine YELLOW (A) YELLOW   APPearance HAZY (A) CLEAR   Specific Gravity, Urine 1.024 1.005 - 1.030   pH 5.0 5.0 - 8.0   Glucose, UA NEGATIVE NEGATIVE mg/dL   Hgb urine dipstick NEGATIVE NEGATIVE   Bilirubin Urine NEGATIVE NEGATIVE   Ketones, ur 5 (A) NEGATIVE mg/dL   Protein, ur NEGATIVE NEGATIVE mg/dL   Nitrite NEGATIVE NEGATIVE   Leukocytes, UA NEGATIVE NEGATIVE   RBC / HPF 0-5 0 - 5 RBC/hpf   WBC, UA 0-5 0 - 5 WBC/hpf   Bacteria, UA NONE SEEN NONE SEEN   Squamous Epithelial / LPF 0-5 0 - 5   Mucus PRESENT    Ca Oxalate Crys, UA PRESENT   Surgical pcr screen     Status: None   Collection Time: 05/20/18  8:53 PM  Result Value Ref Range   MRSA, PCR NEGATIVE NEGATIVE   Staphylococcus aureus NEGATIVE NEGATIVE  Protime-INR     Status: None   Collection Time: 05/20/18 10:41 PM  Result Value Ref Range   Prothrombin Time 13.6 11.4 - 15.2 seconds   INR 1.05   APTT     Status: None   Collection Time: 05/20/18 10:41 PM  Result Value Ref Range   aPTT 27 24 - 36 seconds  Type and screen If not already done in ED     Status: None   Collection Time: 05/20/18 10:41 PM  Result Value Ref Range   ABO/RH(D) O POS    Antibody Screen NEG    Sample Expiration      05/23/2018 Performed at [facility], [address] [place], [place] [address]   CBC     Status: Abnormal   Collection Time: 05/21/18  7:06 AM  Result Value Ref Range   WBC 11.1 (H) 3.8 - 10.6 K/uL   RBC 3.80 (L) 4.40 - 5.90 MIL/uL   Hemoglobin 11.8 (L) 13.0 - 18.0 g/dL   HCT 57.834.9 (L) 46.940.0 - 62.952.0 %   MCV 91.9 80.0 - 100.0 fL   MCH 31.0 26.0 - 34.0 pg   MCHC 33.7 32.0 - 36.0 g/dL   RDW 52.813.6 41.311.5 - 24.414.5 %   Platelets 207 150 - 440 K/uL    Dg Chest 1 View  Result Date: 05/20/2018 CLINICAL DATA:  Right hip pain after fall. EXAM: CHEST  1 VIEW COMPARISON:  Radiographs of November 29, 2015. FINDINGS: Stable cardiomediastinal silhouette. Status post coronary artery bypass graft. Atherosclerosis of thoracic aorta is noted. No pneumothorax is noted. Right lung is clear. Mild left basilar atelectasis is noted with possible small left pleural effusion. Bony thorax is unremarkable. IMPRESSION: Mild  left basilar atelectasis is noted with possible small left pleural effusion. Aortic Atherosclerosis (ICD10-I70.0). Electronically Signed   By: Lupita Raider, M.D.   On: 05/20/2018 17:41   Dg Hip Operative Unilat With Pelvis Right  Result Date: 05/21/2018 CLINICAL DATA:  Right hip fracture. EXAM: OPERATIVE right HIP (WITH PELVIS IF PERFORMED) 12 VIEWS TECHNIQUE: Fluoroscopic spot image(s) were submitted for interpretation post-operatively. Radiation exposure index: 12 mGy. COMPARISON:  Radiographs of May 20, 2018. FINDINGS: Twelve intraoperative fluoroscopic images of the right hip demonstrates surgical reduction and internal fixation of intertrochanteric fracture of the proximal right femur. Intramedullary rod fixation of the femur is noted. Good alignment of fracture components is noted. IMPRESSION: Status post surgical internal fixation of proximal right femoral intertrochanteric fracture. Electronically Signed   By: Lupita Raider, M.D.   On: 05/21/2018 15:41   Dg Hip Unilat  With Pelvis 2-3 Views Right  Result Date: 05/20/2018 CLINICAL DATA:   Fall and acute right hip pain. EXAM: DG HIP (WITH OR WITHOUT PELVIS) 2-3V RIGHT COMPARISON:  04/19/2018 FINDINGS: Fracture involving the proximal right femur. This is an intertrochanteric fracture with displacement of the lesser trochanter. Pelvic bony ring appears to be intact. No gross abnormality to the left hip. Vascular calcifications in the upper thighs. Lateral view is limited but the right femoral head appears to be located. IMPRESSION: Right femoral intertrochanteric fracture. Electronically Signed   By: Richarda Overlie M.D.   On: 05/20/2018 17:41   Dg Femur, Min 2 Views Right  Result Date: 05/21/2018 CLINICAL DATA:  Femur surgery. EXAM: RIGHT FEMUR 2 VIEWS COMPARISON:  RIGHT hip radiograph May 20, 2018 FINDINGS: Femoral neck pinning and intramedullary rod with 2 distal retaining screws transfixing nondisplaced intertrochanteric fracture. Osteopenia without destructive bony lesions. Moderate vascular calcifications. Soft tissue swelling, subcutaneous gas and skin staples consistent with recent surgery. IMPRESSION: Status post recent RIGHT femur ORIF transfixing nondisplaced intertrochanteric fracture. Electronically Signed   By: Awilda Metro M.D.   On: 05/21/2018 17:52    Assessment/Plan: Day of Surgery   Active Problems:   Hip fx [facility])  Patient able postop.  I reviewed the postop x-rays which show the fracture is well reduced, the intramedullary hardware is well-positioned..  There is no evidence of postop complication.  He will will receive 24 hours of postop antibiotics.  Physical therapy will start in the morning.  Patient is weightbearing as tolerated on his right lower extremity.  He will begin Lovenox for DVT prophylaxis tomorrow.  Labs will be checked in the a.m.    Juanell Fairly , MD 05/21/2018, 6:07 PM

## 2018-05-21 NOTE — Anesthesia Preprocedure Evaluation (Addendum)
Anesthesia Evaluation    Reviewed: Allergy & Precautions, H&P , NPO status , Patient's Chart, lab work & pertinent test results, reviewed documented beta blocker date and time   History of Anesthesia Complications Negative for: history of anesthetic complications  Airway Mallampati: II  TM Distance: >3 FB Neck ROM: full    Dental  (+) Dental Advidsory Given, Teeth Intact   Pulmonary former smoker,           Cardiovascular Exercise Tolerance: Good hypertension, (-) angina+ CAD and + CABG  (-) Past MI and (-) Cardiac Stents (-) dysrhythmias (-) Valvular Problems/Murmurs     Neuro/Psych PSYCHIATRIC DISORDERS Anxiety Depression Dementia Parkinson's    GI/Hepatic Neg liver ROS, PUD, GERD  ,  Endo/Other  negative endocrine ROS  Renal/GU negative Renal ROS  negative genitourinary   Musculoskeletal   Abdominal   Peds  Hematology negative hematology ROS (+)   Anesthesia Other Findings Past Medical History: No date: GERD (gastroesophageal reflux disease) No date: Parkinson's disease (HCC)   Reproductive/Obstetrics negative OB ROS                            Anesthesia Physical Anesthesia Plan  ASA: III  Anesthesia Plan: Spinal   Post-op Pain Management:    Induction:   PONV Risk Score and Plan: 2 and Ondansetron, Dexamethasone, Propofol infusion and TIVA  Airway Management Planned:   Additional Equipment:   Intra-op Plan:   Post-operative Plan:   Informed Consent: I have reviewed the patients History and Physical, chart, labs and discussed the procedure including the risks, benefits and alternatives for the proposed anesthesia with the patient or authorized representative who has indicated his/her understanding and acceptance.   History available from chart only  Plan Discussed with: Anesthesiologist, CRNA and Surgeon  Anesthesia Plan Comments:        Anesthesia Quick  Evaluation

## 2018-05-21 NOTE — Transfer of Care (Signed)
Immediate Anesthesia Transfer of Care Note  Patient: Steven Roach  Procedure(s) Performed: INTRAMEDULLARY (IM) NAIL INTERTROCHANTRIC (Right Hip)  Patient Location: PACU  Anesthesia Type:Spinal  Level of Consciousness: sedated  Airway & Oxygen Therapy: Patient Spontanous Breathing and Patient connected to face mask oxygen  Post-op Assessment: Report given to RN and Post -op Vital signs reviewed and stable  Post vital signs: Reviewed  Last Vitals:  Vitals Value Taken Time  BP 103/46 05/21/2018  3:29 PM  Temp    Pulse 73 05/21/2018  3:29 PM  Resp 12 05/21/2018  3:29 PM  SpO2 100 % 05/21/2018  3:29 PM  Vitals shown include unvalidated device data.  Last Pain:  Vitals:   05/21/18 1300  TempSrc: Temporal  PainSc: 0-No pain         Complications: No apparent anesthesia complications

## 2018-05-21 NOTE — Clinical Social Work Placement (Signed)
   CLINICAL SOCIAL WORK PLACEMENT  NOTE  Date:  05/21/2018  Patient Details  Name: Steven Roach MRN: [id number] Date of Birth: [birth year]  Clinical Social Work is seeking post-discharge placement for this patient at the Skilled  Nursing Facility level of care (*CSW will initial, date and re-position this form in  chart as items are completed):  Yes   Patient/family provided with [place] Clinical Social Work Department's list of facilities offering this level of care within the geographic area requested by the patient (or if unable, by the patient's family).  Yes   Patient/family informed of their freedom to choose among providers that offer the needed level of care, that participate in Medicare, Medicaid or managed care program needed by the patient, have an available bed and are willing to accept the patient.  Yes   Patient/family informed of [facility] ownership interest in [facility] and [facility], as well as of the fact that they are under no obligation to receive care at these facilities.  PASRR submitted to EDS on 05/21/18     PASRR number received on 05/21/18     Existing PASRR number confirmed on       FL2 transmitted to all facilities in geographic area requested by pt/family on 05/21/18     FL2 transmitted to all facilities within larger geographic area on       Patient informed that his/her managed care company has contracts with or will negotiate with certain facilities, including the following:        Yes   Patient/family informed of bed offers received.  Patient chooses bed at [facility] )     Physician recommends and patient chooses bed at      Patient to be transferred to   on  .  Patient to be transferred to facility by       Patient family notified on   of transfer.  Name of family member notified:        PHYSICIAN       Additional Comment:    _______________________________________________ Steven Roach, Steven CrockerBailey M,  LCSW 05/21/2018, 11:37 AM

## 2018-05-21 NOTE — Progress Notes (Signed)
[employer] - [place] [facility]   PATIENT NAME: [name] Chilton    MR#:  [id number]  DATE OF BIRTH:  1929-06-12  SUBJECTIVE:   Family is at bedside.  Patient reports that pain is controlled  REVIEW OF SYSTEMS:    Review of Systems  Constitutional: Negative for fever, chills weight loss HENT: Negative for ear pain, nosebleeds, congestion, facial swelling, rhinorrhea, neck pain, neck stiffness and ear discharge.   Respiratory: Negative for cough, shortness of breath, wheezing  Cardiovascular: Negative for chest pain, palpitations and leg swelling.  Gastrointestinal: Negative for heartburn, abdominal pain, vomiting, diarrhea or consitpation Genitourinary: Negative for dysuria, urgency, frequency, hematuria Musculoskeletal: Negative for back pain or joint pain Neurological: Negative for dizziness, seizures, syncope, focal weakness,  numbness and headaches.  Hematological: Does not bruise/bleed easily.  Psychiatric/Behavioral: Negative for hallucinations, confusion, dysphoric mood    Tolerating Diet: npo      DRUG ALLERGIES:   Allergies  Allergen Reactions  . Celecoxib Other (See Comments)    Other Reaction: GI Upset, gastritis    VITALS:  Blood pressure 139/74, pulse 77, temperature 97.7 F (36.5 C), temperature source Oral, resp. rate 18, height 5\' 9"  (1.753 m), weight 145 lb (65.8 kg), SpO2 97 %.  PHYSICAL EXAMINATION:  Constitutional: Appears well-developed and well-nourished. No distress. HENT: Normocephalic. Marland Kitchen. Oropharynx is clear and moist.  Eyes: Conjunctivae and EOM are normal. PERRLA, no scleral icterus.  Neck: Normal ROM. Neck supple. No JVD. No tracheal deviation. CVS: RRR, S1/S2 +, no murmurs, no gallops, no carotid bruit.  Pulmonary: Effort and breath sounds normal, no stridor, rhonchi, wheezes, rales.  Abdominal: Soft. BS +,  no distension, tenderness, rebound or guarding.  Musculoskeletal: Right leg is shortened due to  fracture Neuro: Alert. CN 2-12 grossly intact. No focal deficits. Skin: Sacral decubitus ulcers not visualized as family member requesting deferment until after surgery Psychiatric: Normal mood and affect.      LABORATORY PANEL:   CBC Recent Labs  Lab 05/21/18 0706  WBC 11.1*  HGB 11.8*  HCT 34.9*  PLT 207   ------------------------------------------------------------------------------------------------------------------  Chemistries  Recent Labs  Lab 05/20/18 1803  NA 141  K 4.3  CL 104  CO2 29  GLUCOSE 124*  BUN 19  CREATININE 0.83  CALCIUM 9.0   ------------------------------------------------------------------------------------------------------------------  Cardiac Enzymes No results for input(s): TROPONINI in the last 168 hours. ------------------------------------------------------------------------------------------------------------------  RADIOLOGY:  Dg Chest 1 View  Result Date: 05/20/2018 CLINICAL DATA:  Right hip pain after fall. EXAM: CHEST  1 VIEW COMPARISON:  Radiographs of November 29, 2015. FINDINGS: Stable cardiomediastinal silhouette. Status post coronary artery bypass graft. Atherosclerosis of thoracic aorta is noted. No pneumothorax is noted. Right lung is clear. Mild left basilar atelectasis is noted with possible small left pleural effusion. Bony thorax is unremarkable. IMPRESSION: Mild left basilar atelectasis is noted with possible small left pleural effusion. Aortic Atherosclerosis (ICD10-I70.0). Electronically Signed   By: Lupita RaiderJames  Green Jr, M.D.   On: 05/20/2018 17:41   Dg Hip Unilat  With Pelvis 2-3 Views Right  Result Date: 05/20/2018 CLINICAL DATA:  Fall and acute right hip pain. EXAM: DG HIP (WITH OR WITHOUT PELVIS) 2-3V RIGHT COMPARISON:  04/19/2018 FINDINGS: Fracture involving the proximal right femur. This is an intertrochanteric fracture with displacement of the lesser trochanter. Pelvic bony ring appears to be intact. No gross  abnormality to the left hip. Vascular calcifications in the upper thighs. Lateral view is limited but the right femoral head appears to be located.  IMPRESSION: Right femoral intertrochanteric fracture. Electronically Signed   By: Richarda Overlie M.D.   On: 05/20/2018 17:41     ASSESSMENT AND PLAN:   84 year old male with a history of Parkinson's disease who is status post mechanical fall with right hip fracture.  1.  Right femoral intertrochanteric fracture: Patient is at moderate risk for moderate risk procedure and may proceed without further cardiac work-up. Awaiting orthopedic surgery consultation DVT prophylaxis as per orthopedic surgery. Continue PRN pain medications Follow CBC postop 2.  Sacral wound: Wound care consultation requested  3.  Parkinson's disease: Continue Sinemet  4.  Depression: Continue Zoloft  5.  BPH: Continue Flomax and finasteride        Management plans discussed with the patient and daughter and they are in agreement.  CODE STATUS: DNR  TOTAL TIME TAKING CARE OF THIS PATIENT: 30 minutes.     POSSIBLE D/C 3 days, DEPENDING ON CLINICAL CONDITION.   Ekta Dancer M.D on 05/21/2018 at 8:58 AM  Between 7am to 6pm - Pager - [phone] After 6pm go to [email or web address] - password [email or web address] [facility]  [facility]  Office  [phone]  CC: Primary care physician; Maple Hudson., MD  Note: This dictation was prepared with Dragon dictation along with smaller phrase technology. Any transcriptional errors that result from this process are unintentional.

## 2018-05-21 NOTE — NC FL2 (Signed)
[place] MEDICAID FL2 LEVEL OF CARE SCREENING TOOL     IDENTIFICATION  Patient Name: Steven Roach Birthdate: [birth year] Sex: male Admission Date (Current Location): 05/20/2018  [place] and [place] Number:  Chiropodist and Address:  [facility], [address], [place], [place] [address]      Provider Number: [id number]  Attending Physician Name and Address:  Adrian Saran, MD  Relative Name and Phone Number:       Current Level of Care: Hospital Recommended Level of Care: Skilled Nursing Facility Prior Approval Number:    Date Approved/Denied:   PASRR Number: ([id number])  Discharge Plan: SNF    Current Diagnoses: Patient Active Problem List   Diagnosis Date Noted  . Hip fx (HCC) 05/20/2018  . Borderline diabetes 03/06/2017  . GERD without esophagitis 03/06/2017  . Hyperlipidemia 03/06/2017  . Stomach ulcer 03/06/2017  . Depression 03/06/2017  . History of chickenpox 03/06/2017  . History of measles 03/06/2017  . History of mumps 03/06/2017  . Bilateral renal cysts 03/06/2017  . Impotence 03/06/2017  . Hypertension 03/06/2017  . GAD (generalized anxiety disorder) 02/27/2016  . CAD (coronary artery disease) 05/07/2012  . PD (Parkinson's disease) (HCC) 05/07/2012  . S/P CABG (coronary artery bypass graft) 05/07/2012    Orientation RESPIRATION BLADDER Height & Weight     Self  Normal Continent Weight: 65.8 kg (145 lb) Height:  5\' 9"  (175.3 cm)  BEHAVIORAL SYMPTOMS/MOOD NEUROLOGICAL BOWEL NUTRITION STATUS      Continent Diet(Diet: NPO for surgery to be advanced. )  AMBULATORY STATUS COMMUNICATION OF NEEDS Skin   Extensive Assist Verbally Surgical wounds, PU Stage and Appropriate Care(pressure ulcer stage 2 on sacrum. )                       Personal Care Assistance Level of Assistance  Bathing, Feeding, Dressing Bathing Assistance: Limited assistance Feeding assistance: Independent Dressing Assistance: Limited  assistance     Functional Limitations Info  Sight, Hearing, Speech Sight Info: Adequate Hearing Info: Impaired Speech Info: Adequate    SPECIAL CARE FACTORS FREQUENCY  PT (By licensed PT), OT (By licensed OT)     PT Frequency: (5) OT Frequency: (5)            Contractures      Additional Factors Info  Code Status, Allergies Code Status Info: (DNR ) Allergies Info: (Celecoxib)           Current Medications (05/21/2018):  This is the current hospital active medication list Current Facility-Administered Medications  Medication Dose Route Frequency Provider Last Rate Last Dose  . acetaminophen (TYLENOL) tablet 650 mg  650 mg Oral Q6H PRN Salary, Jetty Duhamel D, MD   650 mg at 05/20/18 2244   Or  . acetaminophen (TYLENOL) suppository 650 mg  650 mg Rectal Q6H PRN Salary, Montell D, MD      . atorvastatin (LIPITOR) tablet 20 mg  20 mg Oral Daily Salary, Montell D, MD      . carbidopa-levodopa (SINEMET IR) 25-100 MG per tablet immediate release 1.5 tablet  1.5 tablet Oral TID Angelina Ok D, MD   1.5 tablet at 05/20/18 2244  . [START ON 05/22/2018] docusate sodium (COLACE) capsule 100 mg  100 mg Oral QODAY Salary, Montell D, MD      . famotidine (PEPCID) IVPB 20 mg premix  20 mg Intravenous Q12H Salary, Evelena Asa, MD   Stopped at 05/20/18 2315  . finasteride (PROSCAR) tablet 5 mg  5 mg Oral Daily Salary, Montell D, MD      . heparin injection 5,000 Units  5,000 Units Subcutaneous Q8H Salary, Montell D, MD      . HYDROcodone-acetaminophen (NORCO/VICODIN) 5-325 MG per tablet 1-2 tablet  1-2 tablet Oral Q4H PRN Salary, Montell D, MD      . liver oil-zinc oxide (DESITIN) 40 % ointment   Topical TID Salary, Montell D, MD      . Melatonin TABS 5 mg  5 mg Oral QHS Salary, Montell D, MD   5 mg at 05/20/18 2244  . morphine 2 MG/ML injection 2 mg  2 mg Intravenous Q2H PRN Salary, Montell D, MD      . ondansetron (ZOFRAN) tablet 4 mg  4 mg Oral Q6H PRN Salary, Montell D, MD       Or  .  ondansetron (ZOFRAN) injection 4 mg  4 mg Intravenous Q6H PRN Salary, Montell D, MD      . sertraline (ZOLOFT) tablet 100 mg  100 mg Oral Daily Salary, Montell D, MD      . sodium chloride flush (NS) 0.9 % injection 3 mL  3 mL Intravenous Q12H Salary, Montell D, MD      . tamsulosin (FLOMAX) capsule 0.4 mg  0.4 mg Oral Daily Salary, Montell D, MD         Discharge Medications: Please see discharge summary for a list of discharge medications.  Relevant Imaging Results:  Relevant Lab Results:   Additional Information (SSN: [id number])  Reyes Fifield, Darleen CrockerBailey M, LCSW

## 2018-05-22 ENCOUNTER — Encounter: Payer: Self-pay | Admitting: Orthopedic Surgery

## 2018-05-22 ENCOUNTER — Encounter
Admission: RE | Admit: 2018-05-22 | Discharge: 2018-05-22 | Disposition: A | Discharge status: Home / Self Care | Payer: Medicare Other | Source: Ambulatory Visit | Attending: Internal Medicine | Admitting: Internal Medicine

## 2018-05-22 DIAGNOSIS — L8990 Pressure ulcer of unspecified site, unspecified stage: Secondary | ICD-10-CM

## 2018-05-22 MED ORDER — PANTOPRAZOLE SODIUM 40 MG PO TBEC
40.0000 mg | DELAYED_RELEASE_TABLET | Freq: Two times a day (BID) | ORAL | Status: DC
  Administered 2018-05-22 – 2018-05-24 (×5): 40 mg via ORAL
  Filled 2018-05-22 (×5): qty 1

## 2018-05-22 NOTE — Progress Notes (Signed)
PT Cancellation Note  Patient Details Name: Steven Roach Causey MRN: [id number] DOB: [birth year]   Cancelled Treatment:    Reason Eval/Treat Not Completed: Fatigue/lethargy limiting ability to participate;Patient'Roach level of consciousness(RN/family consulted. Pt remains altered beyond baseline per family, not following commands for basic mobility in bed per RN. Pt resting some somnolent upon entry. WIll hold treatment at this time and attempt again once patient is better able to participate and communicate needs/pain/concerns.)  1:47 PM, 05/22/18 Rosamaria LintsAllan C Valory Wetherby, PT, DPT Physical Therapist - [facility]  [phone] (ASCOM)     Eneida Evers C 05/22/2018, 1:46 PM

## 2018-05-22 NOTE — Progress Notes (Signed)
OT Cancellation Note  Patient Details Name: Steven Roach MRN: [id number] DOB: [birth year]   Cancelled Treatment:    Reason Eval/Treat Not Completed: Fatigue/lethargy limiting ability to participate. Order received, chart reviewed. Spoke with PT who had just attempted to work with pt. Per PT, pt remains confused, cognition impaired beyond baseline, unable to verbalize needs/pain/concerns. Inappropriate for OT evaluation at this time. Will re-attempt next date as appropriate.   Richrd PrimeJamie Stiller, MPH, MS, OTR/L ascom [phone] 05/22/18, 2:25 PM

## 2018-05-22 NOTE — Evaluation (Signed)
Physical Therapy Evaluation Patient Details Name: Real ConsFrank S Yablonski MRN: [id number] DOB: Aug 22, 1929 Today's Date: 05/22/2018   History of Present Illness  [name] Sirianni is an 84yo male who [facility] p witnessed mechanical fall at ALF, subsequent hip pain. Pt with 3 falls is past 90 days per daughter. Pt admitted with Right Intertrochanteric Fracture, now s/p Rt intramedullary fixation c Dr. Martha ClanKrasinski, WBAT. AT baseline, pt is largely limited by Parkinsonian freezing episodes, Pt is total assist for ADL, but is able to self feed. Pt requires physical assist for all mobility, but does AMB with supervision.   Clinical Impression  Pt admitted with above diagnosis. Pt currently with functional limitations due to the deficits listed below (see "PT Problem List"). Upon entry, pt in bed, daughter present who stays to provide background detail. Pt is awake, appears to be smiling, but this expression does not change much even with grimacing and moaning with exercises later. Pt is intermittently responsive to verbal interaction, following simple commands <50% of time. Pt is often disoriented to situation or time per daughter, but presently seems to be moreso confused. AA/ROM and P/ROM are attempted, but RLE is visually demonstrative of pain limitations, the patient verbally protesting and writhing in the ankles. LLE is better tolerated, but participation is intermittent at best and limited by parkinsonian rigidity. RN/daughter agreeable to trial of pain meds prior to later session this date. Pt will likely participate better when less confused. Pt will benefit from skilled PT intervention to increase independence and safety with basic mobility in preparation for discharge to the venue listed below.       Follow Up Recommendations SNF;Supervision/Assistance - 24 hour    Equipment Recommendations  None recommended by PT    Recommendations for Other Services       Precautions / Restrictions  Precautions Precautions: Fall Restrictions Weight Bearing Restrictions: No RLE Weight Bearing: Weight bearing as tolerated      Mobility  Bed Mobility               General bed mobility comments: deferred d/t uncontrolled pain management.  Transfers                    Ambulation/Gait                Stairs            Wheelchair Mobility    Modified Rankin (Stroke Patients Only)       Balance                                             Pertinent Vitals/Pain Pain Assessment: Faces Faces Pain Scale: Hurts whole lot Pain Descriptors / Indicators: Moaning;Contraction;Operative site guarding Pain Intervention(s): Limited activity within patient's tolerance;Monitored during session    Home Living Family/patient expects to be discharged to:: Assisted living([facility] x2[facility])               Home Equipment: Walker - 4 wheels Additional Comments: Has been working with PT prior to fall.     Prior Function Level of Independence: Needs assistance   Gait / Transfers Assistance Needed: Household distances with [phone], multiple falls (3 in past 90 days); Requires physical assistance with transfers and AMB.   ADL's / Homemaking Assistance Needed: Required total assist d/t freezing episodes, but mostly continenet and able to  communicate needs to caregivers.   Comments: gradually increasing confusion adn disorientation to place and or situation per daugter.      Hand Dominance   Dominant Hand: Right    Extremity/Trunk Assessment                Communication   Communication: HOH  Cognition Arousal/Alertness: Awake/alert Behavior During Therapy: WFL for tasks assessed/performed Overall Cognitive Status: History of cognitive impairments - at baseline(Daughter reports worse AMS right now.  )                                        General Comments      Exercises Other Exercises Other  Exercises: multiple exercises attempted bilaterally, but patient unable to follow commands >50% of time, also unclear how much rigidity is limting too.    Assessment/Plan    PT Assessment Patient needs continued PT services  PT Problem List Decreased strength;Decreased range of motion;Decreased activity tolerance;Decreased mobility;Decreased coordination;Decreased knowledge of precautions;Decreased cognition;Impaired tone       PT Treatment Interventions DME instruction;Balance training;Gait training;Stair training;Functional mobility training;Therapeutic activities;Therapeutic exercise    PT Goals (Current goals can be found in the Care Plan section)  Acute Rehab PT Goals Patient Stated Goal: gradual return to AMB c  4WW PT Goal Formulation: With family Time For Goal Achievement: 06/05/18 Potential to Achieve Goals: Fair    Frequency BID   Barriers to discharge        Co-evaluation               AM-PAC PT "6 Clicks" Daily Activity  Outcome Measure Difficulty turning over in bed (including adjusting bedclothes, sheets and blankets)?: Unable Difficulty moving from lying on back to sitting on the side of the bed? : Unable Difficulty sitting down on and standing up from a chair with arms (e.g., wheelchair, bedside commode, etc,.)?: Unable Help needed moving to and from a bed to chair (including a wheelchair)?: Total Help needed walking in hospital room?: Total Help needed climbing 3-5 steps with a railing? : Total 6 Click Score: 6    End of Session   Activity Tolerance: Patient tolerated treatment well;Patient limited by pain;Other (comment)(AMS ) Patient left: in bed;with family/visitor present;with call bell/phone within reach;with SCD's reapplied(Dr. Kirtland Bouchard ) Nurse Communication: Mobility status PT Visit Diagnosis: Difficulty in walking, not elsewhere classified (R26.2);Pain Pain - Right/Left: Right Pain - part of body: Leg    Time: 1020-1046 PT Time Calculation (min)  (ACUTE ONLY): 26 min   Charges:   PT Evaluation $PT Eval Moderate Complexity: 1 Mod PT Treatments $Therapeutic Activity: 8-22 mins        11:09 AM, 05/22/18 Rosamaria Lints, PT, DPT Physical Therapist - [facility]  [phone] (ASCOM)     Kariel Skillman C 05/22/2018, 11:04 AM

## 2018-05-22 NOTE — Anesthesia Postprocedure Evaluation (Signed)
Anesthesia Post Note  Patient: Steven Roach  Procedure(s) Performed: INTRAMEDULLARY (IM) NAIL INTERTROCHANTRIC (Right Hip)  Patient location during evaluation: Nursing Unit Anesthesia Type: Spinal Level of consciousness: oriented and awake and alert Pain management: pain level controlled Vital Signs Assessment: post-procedure vital signs reviewed and stable Respiratory status: spontaneous breathing, respiratory function stable and patient connected to nasal cannula oxygen Cardiovascular status: blood pressure returned to baseline and stable Postop Assessment: no headache, no backache and no apparent nausea or vomiting Anesthetic complications: no     Last Vitals:  Vitals:   05/22/18 0433 05/22/18 0738  BP: (!) 147/63 (!) 145/64  Pulse: 75 75  Resp: 17 17  Temp: 36.8 C (!) 36.3 C  SpO2: 94% 95%    Last Pain:  Vitals:   05/22/18 0738  TempSrc: Axillary  PainSc: Asleep                 Rica MastBachich,  Seneca Hoback M

## 2018-05-22 NOTE — Progress Notes (Signed)
PHARMACIST - PHYSICIAN COMMUNICATION  DR:   Juliene PinaMody  CONCERNING: IV to Oral Route Change Policy  RECOMMENDATION: This patient is receiving famotidine by the intravenous route.  Based on criteria approved by the Pharmacy and Therapeutics Committee, the intravenous medication(s) is/are being converted to the equivalent oral dose form(s).  **Note: this patient takes PPI PTA but was converted to H2RA IP due to PPI shortage. He will be converted back to his PTA PPI dose**   DESCRIPTION: These criteria include:  The patient is eating (either orally or via tube) and/or has been taking other orally administered medications for a least 24 hours  The patient has no evidence of active gastrointestinal bleeding or impaired GI absorption (gastrectomy, short bowel, patient on TNA or NPO).  If you have questions about this conversion, please contact the Pharmacy Department  []   [phone]( [phone] )  [facility] [x]   [phone]( [phone] )  [facility] []   [phone]( [phone] )  [facility] []   [phone]( [phone] )  [facility] []   [phone]( [phone] )  [facility]   Carola Frostathan A Severn Goddard, [facility] 05/22/2018 8:53 AM

## 2018-05-22 NOTE — Progress Notes (Signed)
Plan is for [facility] to [facility] pending medical [facility]. [facility] is aware of above.   [employer] IncorporatedBailey Sriman Tally, LCSW [phone]

## 2018-05-22 NOTE — Progress Notes (Signed)
Pt appears to be sleeping at this time. At sons request, vital signs and medications held until patient awakes. Will continue to monitor.

## 2018-05-22 NOTE — Progress Notes (Signed)
[employer] [facility] - [place] at [facility]   PATIENT NAME: Steven Roach    MR#:  [id number]  DATE OF BIRTH:  [birth year]  SUBJECTIVE:   Son is at bedside.  Patient appears a bit confused this morning Did well yesterday in the operating room.  REVIEW OF SYSTEMS:    Unable to obtain as patient has confusion  Tolerating Diet:  yes      DRUG ALLERGIES:   Allergies  Allergen Reactions  . Celecoxib Other (See Comments)    Other Reaction: GI Upset, gastritis    VITALS:  Blood pressure (!) 145/64, pulse 75, temperature (!) 97.4 F (36.3 C), temperature source Axillary, resp. rate 17, height 5\' 9"  (1.753 m), weight 63.5 kg, SpO2 95 %.  PHYSICAL EXAMINATION:  Constitutional: Appears well-developed and well-nourished. No distress. HENT: Normocephalic. Marland Kitchen. Oropharynx is clear and moist.  Eyes: Conjunctivae and EOM are normal. PERRLA, no scleral icterus.  Neck: Normal ROM. Neck supple. No JVD. No tracheal deviation. CVS: RRR, S1/S2 +, no murmurs, no gallops, no carotid bruit.  Pulmonary: Effort and breath sounds normal, no stridor, rhonchi, wheezes, rales.  Abdominal: Soft. BS +,  no distension, tenderness, rebound or guarding.  Musculoskeletal: Right leg is shortened due to fracture Neuro: Alert. CN 2-12 grossly intact. No focal deficits. Skin: Skin breakdown on the sacrum Psychiatric: Confused     LABORATORY PANEL:   CBC Recent Labs  Lab 05/21/18 0706  WBC 11.1*  HGB 11.8*  HCT 34.9*  PLT 207   ------------------------------------------------------------------------------------------------------------------  Chemistries  Recent Labs  Lab 05/20/18 1803  NA 141  K 4.3  CL 104  CO2 29  GLUCOSE 124*  BUN 19  CREATININE 0.83  CALCIUM 9.0   ------------------------------------------------------------------------------------------------------------------  Cardiac Enzymes No results for input(s): TROPONINI in the last 168  hours. ------------------------------------------------------------------------------------------------------------------  RADIOLOGY:  Dg Chest 1 View  Result Date: 05/20/2018 CLINICAL DATA:  Right hip pain after fall. EXAM: CHEST  1 VIEW COMPARISON:  Radiographs of November 29, 2015. FINDINGS: Stable cardiomediastinal silhouette. Status post coronary artery bypass graft. Atherosclerosis of thoracic aorta is noted. No pneumothorax is noted. Right lung is clear. Mild left basilar atelectasis is noted with possible small left pleural effusion. Bony thorax is unremarkable. IMPRESSION: Mild left basilar atelectasis is noted with possible small left pleural effusion. Aortic Atherosclerosis (ICD10-I70.0). Electronically Signed   By: Lupita RaiderJames  Green Jr, M.D.   On: 05/20/2018 17:41   Dg Hip Operative Unilat With Pelvis Right  Result Date: 05/21/2018 CLINICAL DATA:  Right hip fracture. EXAM: OPERATIVE right HIP (WITH PELVIS IF PERFORMED) 12 VIEWS TECHNIQUE: Fluoroscopic spot image(s) were submitted for interpretation post-operatively. Radiation exposure index: 12 mGy. COMPARISON:  Radiographs of May 20, 2018. FINDINGS: Twelve intraoperative fluoroscopic images of the right hip demonstrates surgical reduction and internal fixation of intertrochanteric fracture of the proximal right femur. Intramedullary rod fixation of the femur is noted. Good alignment of fracture components is noted. IMPRESSION: Status post surgical internal fixation of proximal right femoral intertrochanteric fracture. Electronically Signed   By: Lupita RaiderJames  Green Jr, M.D.   On: 05/21/2018 15:41   Dg Hip Unilat  With Pelvis 2-3 Views Right  Result Date: 05/20/2018 CLINICAL DATA:  Fall and acute right hip pain. EXAM: DG HIP (WITH OR WITHOUT PELVIS) 2-3V RIGHT COMPARISON:  04/19/2018 FINDINGS: Fracture involving the proximal right femur. This is an intertrochanteric fracture with displacement of the lesser trochanter. Pelvic bony ring appears to be  intact. No gross abnormality to the left hip. Vascular calcifications  in the upper thighs. Lateral view is limited but the right femoral head appears to be located. IMPRESSION: Right femoral intertrochanteric fracture. Electronically Signed   By: Richarda Overlie M.D.   On: 05/20/2018 17:41   Dg Femur, Min 2 Views Right  Result Date: 05/21/2018 CLINICAL DATA:  Femur surgery. EXAM: RIGHT FEMUR 2 VIEWS COMPARISON:  RIGHT hip radiograph May 20, 2018 FINDINGS: Femoral neck pinning and intramedullary rod with 2 distal retaining screws transfixing nondisplaced intertrochanteric fracture. Osteopenia without destructive bony lesions. Moderate vascular calcifications. Soft tissue swelling, subcutaneous gas and skin staples consistent with recent surgery. IMPRESSION: Status post recent RIGHT femur ORIF transfixing nondisplaced intertrochanteric fracture. Electronically Signed   By: Awilda Metro M.D.   On: 05/21/2018 17:52     ASSESSMENT AND PLAN:   84 year old male with a history of Parkinson's disease who is status post mechanical fall with right hip fracture.  1.  Right femoral intertrochanteric fracture: Patient is postoperative day #1 status post intramedullary nailing for right intracolonic hip fracture DVT prophylaxis as per orthopedic surgery Physical therapy consultation today  2.  Sacral skin changes without obvious wound: Continue incontinent barrier cream  3.  Parkinson's disease: Continue Sinemet  4.  Depression: Continue Zoloft  5.  BPH: Continue Flomax and finasteride        Management plans discussed with the patient and daughter and they are in agreement.  CODE STATUS: DNR  TOTAL TIME TAKING CARE OF THIS PATIENT: 24 minutes.     POSSIBLE D/C tomorrow snf, DEPENDING ON CLINICAL CONDITION.   Roan Miklos M.D on 05/22/2018 at 9:49 AM  Between 7am to 6pm - Pager - [phone] After 6pm go to [email or web address] - password [email or web address] [facility]  [facility] [place] [facility]  Office   [phone]  CC: Primary care physician; Maple Hudson., MD  Note: This dictation was prepared with Dragon dictation along with smaller phrase technology. Any transcriptional errors that result from this process are unintentional.

## 2018-05-22 NOTE — Progress Notes (Signed)
Subjective:  Post op day #1 status post intramedullary fixation for right intertrochanteric hip fracture.  Patient is confused and cannot provide an accurate history.  His daughter is at the bedside.  Patient is reported to have had significant agitation overnight trying to pull his Foley out.  Patient is resting comfortably in his bed currently.  Objective:   VITALS:   Vitals:   05/21/18 1919 05/21/18 2324 05/22/18 0433 05/22/18 0738  BP: 125/61 (!) 157/70 (!) 147/63 (!) 145/64  Pulse: 78 76 75 75  Resp: 18 18 17 17   Temp: 98.6 F (37 C) 98.2 F (36.8 C) 98.2 F (36.8 C) (!) 97.4 F (36.3 C)  TempSrc: Oral Oral Oral Axillary  SpO2: 96% 96% 94% 95%  Weight:      Height:        PHYSICAL EXAM: Patient is awake but cannot follow commands. Right lower extremity: Neurovascular intact Sensation intact distally Intact pulses distally Dorsiflexion/Plantar flexion intact Incision: dressing C/D/I No cellulitis present Compartment soft  LABS  No results found for this or any previous visit (from the past 24 hour(s)).  Dg Chest 1 View  Result Date: 05/20/2018 CLINICAL DATA:  Right hip pain after fall. EXAM: CHEST  1 VIEW COMPARISON:  Radiographs of November 29, 2015. FINDINGS: Stable cardiomediastinal silhouette. Status post coronary artery bypass graft. Atherosclerosis of thoracic aorta is noted. No pneumothorax is noted. Right lung is clear. Mild left basilar atelectasis is noted with possible small left pleural effusion. Bony thorax is unremarkable. IMPRESSION: Mild left basilar atelectasis is noted with possible small left pleural effusion. Aortic Atherosclerosis (ICD10-I70.0). Electronically Signed   By: Lupita Raider, M.D.   On: 05/20/2018 17:41   Dg Hip Operative Unilat With Pelvis Right  Result Date: 05/21/2018 CLINICAL DATA:  Right hip fracture. EXAM: OPERATIVE right HIP (WITH PELVIS IF PERFORMED) 12 VIEWS TECHNIQUE: Fluoroscopic spot image(s) were submitted for  interpretation post-operatively. Radiation exposure index: 12 mGy. COMPARISON:  Radiographs of May 20, 2018. FINDINGS: Twelve intraoperative fluoroscopic images of the right hip demonstrates surgical reduction and internal fixation of intertrochanteric fracture of the proximal right femur. Intramedullary rod fixation of the femur is noted. Good alignment of fracture components is noted. IMPRESSION: Status post surgical internal fixation of proximal right femoral intertrochanteric fracture. Electronically Signed   By: Lupita Raider, M.D.   On: 05/21/2018 15:41   Dg Hip Unilat  With Pelvis 2-3 Views Right  Result Date: 05/20/2018 CLINICAL DATA:  Fall and acute right hip pain. EXAM: DG HIP (WITH OR WITHOUT PELVIS) 2-3V RIGHT COMPARISON:  04/19/2018 FINDINGS: Fracture involving the proximal right femur. This is an intertrochanteric fracture with displacement of the lesser trochanter. Pelvic bony ring appears to be intact. No gross abnormality to the left hip. Vascular calcifications in the upper thighs. Lateral view is limited but the right femoral head appears to be located. IMPRESSION: Right femoral intertrochanteric fracture. Electronically Signed   By: Richarda Overlie M.D.   On: 05/20/2018 17:41   Dg Femur, Min 2 Views Right  Result Date: 05/21/2018 CLINICAL DATA:  Femur surgery. EXAM: RIGHT FEMUR 2 VIEWS COMPARISON:  RIGHT hip radiograph May 20, 2018 FINDINGS: Femoral neck pinning and intramedullary rod with 2 distal retaining screws transfixing nondisplaced intertrochanteric fracture. Osteopenia without destructive bony lesions. Moderate vascular calcifications. Soft tissue swelling, subcutaneous gas and skin staples consistent with recent surgery. IMPRESSION: Status post recent RIGHT femur ORIF transfixing nondisplaced intertrochanteric fracture. Electronically Signed   By: Michel Santee.D.  On: 05/21/2018 17:52    Assessment/Plan: 1 Day Post-Op   Active Problems:   Hip fx (HCC)   Pressure  injury of skin  Patient is stable postop.  Patient having postop confusion.  He is only taking Tylenol for pain and I would continue to do so unless the patient is having significant pain.  Patient's does not show any morning lab results.  Begin physical therapy as the patient can participate.  He is full weightbearing on the right lower extremity.  Patient is not on any activity restrictions.  He will begin Lovenox 40 mg daily for DVT prophylaxis.  Continue teds and foot pumps.  I reviewed the operative x-ray with the daughter and gave her a paper copy of the AP images.    Juanell FairlyKRASINSKI, Andilyn Bettcher , MD 05/22/2018, 10:59 AM

## 2018-05-22 NOTE — Consult Note (Signed)
WOC Nurse wound follow up [facility].  The air mattress I ordered yesterday and requested be delivered and placed before he returned from surgery, was not ordered.  This morning the patient is on a low bed.  The unit secretary is ordering the air mattress now.  As for the wound on the sacrum, there is not a wound anywhere on the sacrum/coccyx/buttocks. Plan of care: Apply incontinent barrier cream available in the clean supply room with peri-care.  Turn the patient, float the heels. WOC nurse will not follow at this time.  Please re-consult the WOC team if needed. Discussed plan of care with the primary RN, Angelena SoleSusana, the patient and his male family member in the room. Helmut MusterSherry Shauntee Karp, RN, MSN, CWOCN, CNS-BC, pager [phone]

## 2018-05-23 ENCOUNTER — Other Ambulatory Visit: Payer: Self-pay

## 2018-05-23 DIAGNOSIS — S72001A Fracture of unspecified part of neck of right femur, initial encounter for closed fracture: Secondary | ICD-10-CM

## 2018-05-23 DIAGNOSIS — Z515 Encounter for palliative care: Secondary | ICD-10-CM

## 2018-05-23 DIAGNOSIS — Z419 Encounter for procedure for purposes other than remedying health state, unspecified: Secondary | ICD-10-CM

## 2018-05-23 DIAGNOSIS — Z7189 Other specified counseling: Secondary | ICD-10-CM

## 2018-05-23 LAB — BASIC METABOLIC PANEL
ANION GAP: 13 (ref 5–15)
BUN: 16 mg/dL (ref 8–23)
CO2: 25 mmol/L (ref 22–32)
Calcium: 8.3 mg/dL — ABNORMAL LOW (ref 8.9–10.3)
Chloride: 103 mmol/L (ref 98–111)
Creatinine, Ser: 0.79 mg/dL (ref 0.61–1.24)
GFR calc Af Amer: >60 mL/min (ref >60)
GLUCOSE: 127 mg/dL — AB (ref 70–99)
POTASSIUM: 3.9 mmol/L (ref 3.5–5.1)
SODIUM: 141 mmol/L (ref 135–145)

## 2018-05-23 LAB — CBC
HCT: 30.6 % — ABNORMAL LOW (ref 40.0–52.0)
Hemoglobin: 10.3 g/dL — ABNORMAL LOW (ref 13.0–18.0)
MCH: 31.5 pg (ref 26.0–34.0)
MCHC: 33.7 g/dL (ref 32.0–36.0)
MCV: 93.6 fL (ref 80.0–100.0)
PLATELETS: 182 10*3/uL (ref 150–440)
RBC: 3.26 MIL/uL — ABNORMAL LOW (ref 4.40–5.90)
RDW: 13.9 % (ref 11.5–14.5)
WBC: 11.1 10*3/uL — ABNORMAL HIGH (ref 3.8–10.6)

## 2018-05-23 MED ORDER — TRAMADOL HCL 50 MG PO TABS: 50.0000 mg | ORAL_TABLET | Freq: Four times a day (QID) | ORAL | 0 refills | Status: DC | PRN

## 2018-05-23 MED ORDER — ENOXAPARIN SODIUM 40 MG/0.4ML ~~LOC~~ SOLN: 40.0000 mg | SUBCUTANEOUS | 0 refills | Status: AC

## 2018-05-23 NOTE — Telephone Encounter (Signed)
Rx sent to [facility] phone : [phone] , fax : [phone]  

## 2018-05-23 NOTE — Progress Notes (Signed)
[employer] [facility] - [place] at [facility]   PATIENT NAME: Steven Roach    MR#:  [id number]  DATE OF BIRTH:  1929/08/01  SUBJECTIVE:   Family upset about potential discharge patient did not sleep for 2 days   REVIEW OF SYSTEMS:    Unable to obtain as patient has confusion  Tolerating Diet:  yes      DRUG ALLERGIES:   Allergies  Allergen Reactions  . Celecoxib Other (See Comments)    Other Reaction: GI Upset, gastritis    VITALS:  Blood pressure 140/62, pulse 95, temperature 98.3 F (36.8 C), temperature source Oral, resp. rate 18, height 5\' 9"  (1.753 m), weight 63.5 kg, SpO2 96 %.  PHYSICAL EXAMINATION:  Constitutional: Appears well-developed and well-nourished. No distress. HENT: Normocephalic. Marland Kitchen. Oropharynx is clear and moist.  Eyes: Conjunctivae and EOM are normal. PERRLA, no scleral icterus.  Neck: Normal ROM. Neck supple. No JVD. No tracheal deviation. CVS: RRR, S1/S2 +, no murmurs, no gallops, no carotid bruit.  Pulmonary: Effort and breath sounds normal, no stridor, rhonchi, wheezes, rales.  Abdominal: Soft. BS +,  no distension, tenderness, rebound or guarding.  Musculoskeletal: Right leg is shortened due to fracture Neuro: Alert. CN 2-12 grossly intact. No focal deficits. Skin: Skin breakdown on the sacrum Psychiatric: Confused     LABORATORY PANEL:   CBC Recent Labs  Lab 05/23/18 0650  WBC 11.1*  HGB 10.3*  HCT 30.6*  PLT 182   ------------------------------------------------------------------------------------------------------------------  Chemistries  Recent Labs  Lab 05/23/18 0650  NA 141  K 3.9  CL 103  CO2 25  GLUCOSE 127*  BUN 16  CREATININE 0.79  CALCIUM 8.3*   ------------------------------------------------------------------------------------------------------------------  Cardiac Enzymes No results for input(s): TROPONINI in the last 168  hours. ------------------------------------------------------------------------------------------------------------------  RADIOLOGY:  Dg Hip Operative Unilat With Pelvis Right  Result Date: 05/21/2018 CLINICAL DATA:  Right hip fracture. EXAM: OPERATIVE right HIP (WITH PELVIS IF PERFORMED) 12 VIEWS TECHNIQUE: Fluoroscopic spot image(s) were submitted for interpretation post-operatively. Radiation exposure index: 12 mGy. COMPARISON:  Radiographs of May 20, 2018. FINDINGS: Twelve intraoperative fluoroscopic images of the right hip demonstrates surgical reduction and internal fixation of intertrochanteric fracture of the proximal right femur. Intramedullary rod fixation of the femur is noted. Good alignment of fracture components is noted. IMPRESSION: Status post surgical internal fixation of proximal right femoral intertrochanteric fracture. Electronically Signed   By: Lupita RaiderJames  Green Jr, M.D.   On: 05/21/2018 15:41   Dg Femur, Min 2 Views Right  Result Date: 05/21/2018 CLINICAL DATA:  Femur surgery. EXAM: RIGHT FEMUR 2 VIEWS COMPARISON:  RIGHT hip radiograph May 20, 2018 FINDINGS: Femoral neck pinning and intramedullary rod with 2 distal retaining screws transfixing nondisplaced intertrochanteric fracture. Osteopenia without destructive bony lesions. Moderate vascular calcifications. Soft tissue swelling, subcutaneous gas and skin staples consistent with recent surgery. IMPRESSION: Status post recent RIGHT femur ORIF transfixing nondisplaced intertrochanteric fracture. Electronically Signed   By: Awilda Metroourtnay  Bloomer M.D.   On: 05/21/2018 17:52     ASSESSMENT AND PLAN:   84 year old male with a history of Parkinson's disease who is status post mechanical fall with right hip fracture.  1. Right femoral intertrochanteric fracture: Patient is postoperative day #2 status post intramedullary nailing for right intracolonic hip fracture DVT prophylaxis as per orthopedic surgery with Lovenox. Physical  therapy is recommended skilled nursing facility upon discharge  2. Sacral skin changes without obvious wound: Continue incontinent barrier cream  3. Parkinson's disease: Continue Sinemet  4. Depression: Continue Zoloft  5. BPH: Continue Flomax and finasteride  Outpatient palliative care consult      Management plans discussed with the patient and daughter and they are in agreement.  CODE STATUS: DNR  TOTAL TIME TAKING CARE OF THIS PATIENT: 24 minutes.     POSSIBLE D/C tomorrow snf, DEPENDING ON CLINICAL CONDITION.   Vena Bassinger M.D on 05/23/2018 at 10:28 AM  Between 7am to 6pm - Pager - [phone] After 6pm go to [email or web address] - password [email or web address] [facility]  [facility] [place] [facility]  Office  [phone]  CC: Primary care physician; Maple Hudson., MD  Note: This dictation was prepared with Dragon dictation along with smaller phrase technology. Any transcriptional errors that result from this process are unintentional.

## 2018-05-23 NOTE — Progress Notes (Signed)
Family is requesting no narcotic pain medications but does want patient to participate in pt.  Informed family patients pain level may limit his  Mobility. Family ok with ultram being given about an hour prior to PT. Will inform next shift RN .

## 2018-05-23 NOTE — Progress Notes (Signed)
OT Cancellation Note  Patient Details Name: Steven Roach MRN: [id number] DOB: Apr 17, 1929   Cancelled Treatment:    Reason Eval/Treat Not Completed: Patient's level of consciousness;Fatigue/lethargy limiting ability to participate. Pt continues to be lethargic, minimally responsive, unable to actively and meaningfully participate in OT evaluation at this time. Will continue to hold and re-attempt at later date/time as pt is medically appropriate and able to participate.   Richrd PrimeJamie Stiller, MPH, MS, OTR/L ascom [phone] 05/23/18, 10:59 AM

## 2018-05-23 NOTE — Progress Notes (Signed)
PT Cancellation Note  Patient Details Name: Real ConsFrank S Sancho MRN: [id number] DOB: 09/09/1929   Cancelled Treatment:    Reason Eval/Treat Not Completed: Fatigue/lethargy limiting ability to participate;Patient's level of consciousness(PT arrived, pt's daughter reports pt unable to remain awake. ) Several attempts to stimulate patient with gentle tactile stimulation at bilat shoulder, head, and hands: a few momentary startle responses. Pt responding verbally 2-3x, heavily slurred, incomprehensible speech.   Daughter verbalizes concerns for pt being D/C to [facility] in current state. Author affirmed what orthopedist had already educated family on regarding commonality of AMS s/p anesthesia, and related personal experience working with these patients. PT offered active listening, and acknowledged patient's concerns. Promise made to relay concerns to RN/Attending which author immediately did at end of session attempt.  Will attempt again later in day.     9:58 AM, 05/23/18 Rosamaria LintsAllan C Buccola, PT, DPT Physical Therapist - [facility]  [phone] (ASCOM)    Buccola,Allan C 05/23/2018, 9:56 AM

## 2018-05-23 NOTE — Care Management Important Message (Signed)
Copy of signed IM left with patient in room.  

## 2018-05-23 NOTE — Progress Notes (Signed)
84 yo male POD#2 status post intramedullary nail for right hip fracture  Patient is lethargic and responds poorly to instructions.  Family states he has not slept in a few days. Physical therapy unable to work with him this morning due to lethargy.  Anticipate DC to SNF this weekend  General: Lethargic NAD Wound: dressings are dry and intact. Inferior honeycomb dressing CDI. Superior bandage has dried blood, no signs of active bleeding or drainage.   Neurovascular: Capillary refill intact to right lower extremity. Poor response to command to move foot and toes. Family state he has been moving RLE throughout the night.  Assessment:  84 yo male POD#2 status post intramedullary nail for right hip fracture  Plan:  Continue DVT prophylaxis with Lovenox Discharge to skilled nursing facility when cleared by physical therapy and medicine team

## 2018-05-23 NOTE — Consult Note (Signed)
Consultation Note Date: 05/23/2018   Patient Name: Steven Roach  DOB: August 22, 1929  MRN: [id number]  Age / Sex: 84 y.o., male  PCP: Maple Hudson., MD Referring Physician: Adrian Saran, MD  Reason for Consultation: Establishing goals of care  HPI/Patient Profile: Steven Roach  is a 84 y.o. male status post mechanical fall at ALF, ER work-up noted for right trochanteric hip fracture,  Parkinson's disease.    Clinical Assessment and Goals of Care: Patient is resting in bed. Opens eyes briefly. Does not answer questions. Son and daughter are at bedside. They state they requested this meeting to be sure they are making good decisions.   Mr. Stern lives in assisted living. He lived there with his wife before she died 18 months ago. He and his wife both had parkinson's. Children states he suffers from a broken heart. His wife did everything for him, and now he wants others to do for him, even things he could do himself. Son and daughter states he has told them he is ready to die numerous times.  At baseline, he feeds himself, walks stiffly. He has lost 30 pounds in several years.   They are concerned he is 2 days post op, and was doing better yesterday, but is sleeping more today. They feel he will not try to get well. Discussed plans for D/C to SNF for rehab. Discussed palliative vs hospice. They feel their father is ready for comfort level care even if not yet a hospice candidate, however, they would like to discuss themselves further, and do not wish to fill out a MOST form at this time.   Recommended palliative with transition to hospice when appropriate.         SUMMARY OF RECOMMENDATIONS   Recommend palliative at D/C.   Code Status/Advance Care Planning:  DNR    Symptom Management:   Per primary team.  Palliative Prophylaxis:   Delirium Protocol, Eye Care and Oral  Care    Prognosis:   Unable to determine  Discharge Planning: Skilled Nursing Facility for rehab with Palliative care service follow-up      Primary Diagnoses: Present on Admission: . Hip fx (HCC)   I have reviewed the medical record, interviewed the patient and family, and examined the patient. The following aspects are pertinent.  Past Medical History:  Diagnosis Date  . GERD (gastroesophageal reflux disease)   . Parkinson's disease [facility])    Social History   Socioeconomic History  . Marital status: Widowed    Spouse name: Not on file  . Number of children: 2  . Years of education: Not on file  . Highest education level: 12th grade  Occupational History  . Not on file  Social Needs  . Financial resource strain: Not hard at all  . Food insecurity:    Worry: Never true    Inability: Never true  . Transportation needs:    Medical: No    Non-medical: No  Tobacco Use  . Smoking status: Former Smoker  Packs/day: 1.00    Years: 15.00    Pack years: 15.00    Types: Cigarettes    Last attempt to quit: 10/16/1963    Years since quitting: 54.6  . Smokeless tobacco: Never Used  Substance and Sexual Activity  . Alcohol use: No  . Drug use: No  . Sexual activity: Never  Lifestyle  . Physical activity:    Days per week: Not on file    Minutes per session: Not on file  . Stress: Very much  Relationships  . Social connections:    Talks on phone: Not on file    Gets together: Not on file    Attends religious service: Not on file    Active member of club or organization: Not on file    Attends meetings of clubs or organizations: Not on file    Relationship status: Not on file  Other Topics Concern  . Not on file  Social History Narrative  . Not on file   Family History  Problem Relation Age of Onset  . Diabetes Mother   . Breast cancer Mother   . Heart attack Sister   . Diabetes Brother    Scheduled Meds: . atorvastatin  20 mg Oral Daily  .  carbidopa-levodopa  1.5 tablet Oral TID  . docusate sodium  100 mg Oral BID  . enoxaparin (LOVENOX) injection  40 mg Subcutaneous Q24H  . finasteride  5 mg Oral Daily  . liver oil-zinc oxide   Topical TID  . Melatonin  5 mg Oral QHS  . pantoprazole  40 mg Oral BID AC  . sertraline  100 mg Oral Daily  . sodium chloride flush  3 mL Intravenous Q12H  . tamsulosin  0.4 mg Oral Daily  . traMADol  50 mg Oral Q6H   Continuous Infusions: . 0.9 % NaCl with KCl 20 mEq / L 75 mL/hr at 05/22/18 0906  . methocarbamol (ROBAXIN) IV     PRN Meds:.acetaminophen, alum & mag hydroxide-simeth, bisacodyl, HYDROcodone-acetaminophen, magnesium citrate, methocarbamol **OR** methocarbamol (ROBAXIN) IV, metoCLOPramide **OR** metoCLOPramide (REGLAN) injection, morphine injection, ondansetron **OR** ondansetron (ZOFRAN) IV, polyethylene glycol Medications Prior to Admission:  Prior to Admission medications   Medication Sig Start Date End Date Taking? Authorizing Provider  acetaminophen (TYLENOL) 500 MG tablet Take 1 tablet (500MG ) by mouth every night at bedtime as scheduled and 1 tablet every 6 hours as needed for pain   Yes [provider]  aluminum hydroxide-magnesium carbonate (GAVISCON) 95-358 MG/15ML SUSP Take 15 mLs by mouth every evening. For heartburn 03/25/18  Yes Maple HudsonGilbert, Richard L Jr., MD  aspirin 81 MG tablet Take 81 mg by mouth daily.   Yes [provider]  atorvastatin (LIPITOR) 20 MG tablet Take 1 tablet (20 mg total) by mouth daily. 01/17/18  Yes Maple HudsonGilbert, Richard L Jr., MD  carbidopa-levodopa (SINEMET IR) 25-100 MG tablet TAKE 1.5 TABLETS BY MOUTH 3 TIMES DAILY FOR PARKINSON'S 12/26/17  Yes Huston FoleyAthar, Saima, MD  dextromethorphan-guaiFENesin (MUCINEX DM) 30-600 MG 12hr tablet Take 1 tablet by mouth 2 (two) times daily as needed for cough.   Yes [provider]  docusate sodium (COLACE) 100 MG capsule Take 100 mg by mouth every other day.   Yes [provider]  finasteride  (PROSCAR) 5 MG tablet Take 5 mg by mouth daily.  03/22/10  Yes [provider]  Melatonin 3 MG TABS Take 3 mg by mouth every evening.   Yes [provider]  ondansetron (ZOFRAN) 4 MG tablet  Take 4 mg by mouth every 8 (eight) hours as needed for nausea or vomiting.   Yes [provider]  pantoprazole (PROTONIX) 40 MG tablet Take 40 mg by mouth 2 (two) times daily.    Yes [provider]  salicyclic acid-sulfur (SEBULEX) 2-2 % shampoo Apply topically twice weekly for seborrheic dermatitis   Yes [provider]  sertraline (ZOLOFT) 100 MG tablet Take 100 mg by mouth daily.    Yes [provider]  Skin Protectants, Misc. (BAZA PROTECT EX) Apply topically 3 (three) times daily.   Yes [provider]  tamsulosin (FLOMAX) 0.4 MG CAPS capsule Take 0.4 mg by mouth daily.  03/22/10  Yes [provider]  enoxaparin (LOVENOX) 40 MG/0.4ML injection Inject 0.4 mLs (40 mg total) into the skin daily for 13 days. 05/24/18 06/06/18  Adrian Saran, MD  traMADol (ULTRAM) 50 MG tablet Take 1 tablet (50 mg total) by mouth every 6 (six) hours as needed. 05/23/18   Sharee Holster, NP   Allergies  Allergen Reactions  . Celecoxib Other (See Comments)    Other Reaction: GI Upset, gastritis   Review of Systems  Unable to perform ROS   Physical Exam  Constitutional: No distress.  Pulmonary/Chest: Effort normal.  Neurological:  Awakens to voice, falls back asleep.   Skin: Skin is warm and dry.    Vital Signs: BP 140/62   Pulse 95   Temp 98.3 F (36.8 C) (Oral)   Resp 18   Ht 5\' 9"  (1.753 m)   Wt 63.5 kg   SpO2 96%   BMI 20.67 kg/m  Pain Scale: PAINAD POSS *See Group Information*: 1-Acceptable,Awake and alert Pain Score: 4    SpO2: SpO2: 96 % O2 Device:SpO2: 96 % O2 Flow Rate: .O2 Flow Rate (L/min): 5 L/min  IO: Intake/output summary:   Intake/Output Summary (Last 24 hours) at 05/23/2018 1455 Last data filed at 05/23/2018 1015 Gross per  24 hour  Intake 120 ml  Output -  Net 120 ml    LBM: Last BM Date: 05/19/18 Baseline Weight: Weight: 65.8 kg Most recent weight: Weight: 63.5 kg     Palliative Assessment/Data: Post op     Time In: 2:20 Time Out: 3:15 Time Total: 55 min Greater than 50%  of this time was spent counseling and coordinating care related to the above assessment and plan.  Signed by: Morton Stall, NP   Please contact Palliative Medicine Team phone at [phone] for questions and concerns.  For individual provider: See Loretha Stapler

## 2018-05-23 NOTE — Discharge Summary (Addendum)
[facility] [place] [facility]   PATIENT NAME: Steven Roach    MR#:  [id number]  DATE OF BIRTH:  March 31, 1929  DATE OF ADMISSION:  05/20/2018 ADMITTING PHYSICIAN: Bertrum Sol, MD  DATE OF DISCHARGE: 05/23/2018  PRIMARY CARE PHYSICIAN: Maple Hudson., MD    ADMISSION DIAGNOSIS:  Weakness [R53.1] Closed fracture of right hip, initial encounter (HCC) [S72.001A]  DISCHARGE DIAGNOSIS:  Active Problems:   Hip fx (HCC)    SECONDARY DIAGNOSIS:   Past Medical History:  Diagnosis Date  . GERD (gastroesophageal reflux disease)   . Parkinson's disease [facility])     HOSPITAL COURSE:   84 year old male with a history of Parkinson's disease who is status post mechanical fall with right hip fracture.  1.  Right femoral intertrochanteric fracture: Patient is postoperative day #2 status post intramedullary nailing for right intracolonic hip fracture DVT prophylaxis as per orthopedic surgery with Lovenox. Physical therapy is recommended skilled nursing facility upon discharge  2.  Sacral skin changes without obvious wound: Continue incontinent barrier cream  3.  Parkinson's disease: Continue Sinemet  4.  Depression: Continue Zoloft  5.  BPH: Continue Flomax and finasteride  Outpatient palliative care consult  DISCHARGE CONDITIONS AND DIET:   Stable for discharge Regular diet  CONSULTS OBTAINED:    DRUG ALLERGIES:   Allergies  Allergen Reactions  . Celecoxib Other (See Comments)    Other Reaction: GI Upset, gastritis    DISCHARGE MEDICATIONS:   Allergies as of 05/23/2018      Reactions   Celecoxib Other (See Comments)   Other Reaction: GI Upset, gastritis      Medication List    STOP taking these medications   BAZA PROTECT EX   salicyclic acid-sulfur 2-2 % shampoo Commonly known as:  SEBULEX     TAKE these medications   acetaminophen 500 MG tablet Commonly known as:  TYLENOL Take 1 tablet (500MG ) by mouth every night  at bedtime as scheduled and 1 tablet every 6 hours as needed for pain   aluminum hydroxide-magnesium carbonate 95-358 MG/15ML Susp Commonly known as:  GAVISCON Take 15 mLs by mouth every evening. For heartburn   aspirin 81 MG tablet Take 81 mg by mouth daily.   atorvastatin 20 MG tablet Commonly known as:  LIPITOR Take 1 tablet (20 mg total) by mouth daily.   carbidopa-levodopa 25-100 MG tablet Commonly known as:  SINEMET IR TAKE 1.5 TABLETS BY MOUTH 3 TIMES DAILY FOR PARKINSON'S   dextromethorphan-guaiFENesin 30-600 MG 12hr tablet Commonly known as:  MUCINEX DM Take 1 tablet by mouth 2 (two) times daily as needed for cough.   docusate sodium 100 MG capsule Commonly known as:  COLACE Take 100 mg by mouth every other day.   enoxaparin 40 MG/0.4ML injection Commonly known as:  LOVENOX Inject 0.4 mLs (40 mg total) into the skin daily for 13 days. Start taking on:  05/24/2018   finasteride 5 MG tablet Commonly known as:  PROSCAR Take 5 mg by mouth daily.   Melatonin 3 MG Tabs Take 3 mg by mouth every evening.   ondansetron 4 MG tablet Commonly known as:  ZOFRAN Take 4 mg by mouth every 8 (eight) hours as needed for nausea or vomiting.   pantoprazole 40 MG tablet Commonly known as:  PROTONIX Take 40 mg by mouth 2 (two) times daily.   sertraline 100 MG tablet Commonly known as:  ZOLOFT Take 100 mg by mouth daily.   tamsulosin 0.4 MG Caps  capsule Commonly known as:  FLOMAX Take 0.4 mg by mouth daily.   traMADol 50 MG tablet Commonly known as:  ULTRAM Take 1 tablet (50 mg total) by mouth every 6 (six) hours as needed.         Today   CHIEF COMPLAINT:   Daughter concern about discharge today since patient did not sleep well for 2 nights and did not work with PT for a long time yesterday   VITAL SIGNS:  Blood pressure 140/62, pulse 95, temperature 98.3 F (36.8 C), temperature source Oral, resp. rate 18, height 5\' 9"  (1.753 m), weight 63.5 kg, SpO2 96  %.   REVIEW OF SYSTEMS:  Review of Systems  Unable to perform ROS: Dementia     PHYSICAL EXAMINATION:  GENERAL:  84 y.o.-year-old patient lying in the bed with no acute distress.  NECK:  Supple, no jugular venous distention. No thyroid enlargement, no tenderness.  LUNGS: Normal breath sounds bilaterally, no wheezing, rales,rhonchi  No use of accessory muscles of respiration.  CARDIOVASCULAR: S1, S2 normal. No murmurs, rubs, or gallops.  ABDOMEN: Soft, non-tender, non-distended. Bowel sounds present. No organomegaly or mass.  EXTREMITIES: No pedal edema, cyanosis, or clubbing.  PSYCHIATRIC: The patient is alert and oriented x name SKIN: No obvious rash, lesion, or ulcer.   DATA REVIEW:   CBC Recent Labs  Lab 05/23/18 0650  WBC 11.1*  HGB 10.3*  HCT 30.6*  PLT 182    Chemistries  Recent Labs  Lab 05/23/18 0650  NA 141  K 3.9  CL 103  CO2 25  GLUCOSE 127*  BUN 16  CREATININE 0.79  CALCIUM 8.3*    Cardiac Enzymes No results for input(s): TROPONINI in the last 168 hours.  Microbiology Results  @MICRORSLT48 @  RADIOLOGY:  Dg Hip Operative Unilat With Pelvis Right  Result Date: 05/21/2018 CLINICAL DATA:  Right hip fracture. EXAM: OPERATIVE right HIP (WITH PELVIS IF PERFORMED) 12 VIEWS TECHNIQUE: Fluoroscopic spot image(s) were submitted for interpretation post-operatively. Radiation exposure index: 12 mGy. COMPARISON:  Radiographs of May 20, 2018. FINDINGS: Twelve intraoperative fluoroscopic images of the right hip demonstrates surgical reduction and internal fixation of intertrochanteric fracture of the proximal right femur. Intramedullary rod fixation of the femur is noted. Good alignment of fracture components is noted. IMPRESSION: Status post surgical internal fixation of proximal right femoral intertrochanteric fracture. Electronically Signed   By: Lupita RaiderJames  Green Jr, M.D.   On: 05/21/2018 15:41   Dg Femur, Min 2 Views Right  Result Date: 05/21/2018 CLINICAL DATA:   Femur surgery. EXAM: RIGHT FEMUR 2 VIEWS COMPARISON:  RIGHT hip radiograph May 20, 2018 FINDINGS: Femoral neck pinning and intramedullary rod with 2 distal retaining screws transfixing nondisplaced intertrochanteric fracture. Osteopenia without destructive bony lesions. Moderate vascular calcifications. Soft tissue swelling, subcutaneous gas and skin staples consistent with recent surgery. IMPRESSION: Status post recent RIGHT femur ORIF transfixing nondisplaced intertrochanteric fracture. Electronically Signed   By: Awilda Metroourtnay  Bloomer M.D.   On: 05/21/2018 17:52      Allergies as of 05/23/2018      Reactions   Celecoxib Other (See Comments)   Other Reaction: GI Upset, gastritis      Medication List    STOP taking these medications   BAZA PROTECT EX   salicyclic acid-sulfur 2-2 % shampoo Commonly known as:  SEBULEX     TAKE these medications   acetaminophen 500 MG tablet Commonly known as:  TYLENOL Take 1 tablet (500MG ) by mouth every night at bedtime as scheduled and  1 tablet every 6 hours as needed for pain   aluminum hydroxide-magnesium carbonate 95-358 MG/15ML Susp Commonly known as:  GAVISCON Take 15 mLs by mouth every evening. For heartburn   aspirin 81 MG tablet Take 81 mg by mouth daily.   atorvastatin 20 MG tablet Commonly known as:  LIPITOR Take 1 tablet (20 mg total) by mouth daily.   carbidopa-levodopa 25-100 MG tablet Commonly known as:  SINEMET IR TAKE 1.5 TABLETS BY MOUTH 3 TIMES DAILY FOR PARKINSON'S   dextromethorphan-guaiFENesin 30-600 MG 12hr tablet Commonly known as:  MUCINEX DM Take 1 tablet by mouth 2 (two) times daily as needed for cough.   docusate sodium 100 MG capsule Commonly known as:  COLACE Take 100 mg by mouth every other day.   enoxaparin 40 MG/0.4ML injection Commonly known as:  LOVENOX Inject 0.4 mLs (40 mg total) into the skin daily for 13 days. Start taking on:  05/24/2018   finasteride 5 MG tablet Commonly known as:   PROSCAR Take 5 mg by mouth daily.   Melatonin 3 MG Tabs Take 3 mg by mouth every evening.   ondansetron 4 MG tablet Commonly known as:  ZOFRAN Take 4 mg by mouth every 8 (eight) hours as needed for nausea or vomiting.   pantoprazole 40 MG tablet Commonly known as:  PROTONIX Take 40 mg by mouth 2 (two) times daily.   sertraline 100 MG tablet Commonly known as:  ZOLOFT Take 100 mg by mouth daily.   tamsulosin 0.4 MG Caps capsule Commonly known as:  FLOMAX Take 0.4 mg by mouth daily.   traMADol 50 MG tablet Commonly known as:  ULTRAM Take 1 tablet (50 mg total) by mouth every 6 (six) hours as needed.          Management plans discussed with the patient;s daughter   Stable for discharge snf  Patient should follow up with ortho  CODE STATUS:     Code Status Orders  (From admission, onward)         Start     Ordered   05/20/18 2208  Do not attempt resuscitation (DNR)  Continuous    Question Answer Comment  In the event of cardiac or respiratory ARREST Do not call a "code blue"   In the event of cardiac or respiratory ARREST Do not perform Intubation, CPR, defibrillation or ACLS   In the event of cardiac or respiratory ARREST Use medication by any route, position, wound care, and other measures to relive pain and suffering. May use oxygen, suction and manual treatment of airway obstruction as needed for comfort.   Comments nurse may pronounce      05/20/18 2209        Code Status History    Date Active Date Inactive Code Status Order ID Comments User Context   05/20/2018 2012 05/20/2018 2209 DNR [id number]  Salary, Evelena Asa, MD ED    Advance Directive Documentation     Most Recent Value  Type of Advance Directive  Healthcare Power of Attorney  Pre-existing out of facility DNR order (yellow form or pink MOST form)  Yellow form placed in chart (order not valid for inpatient use)  "MOST" Form in Place?  -      TOTAL TIME TAKING CARE OF THIS PATIENT: 38 minutes.     Note: This dictation was prepared with Dragon dictation along with smaller phrase technology. Any transcriptional errors that result from this process are unintentional.  Athalene Kolle M.D on 05/23/2018 at 9:24  AM  Between 7am to 6pm - Pager - [phone] After 6pm go to [email or web address] - Social research officer, government  [facility]  Office  [phone]  CC: Primary care physician; Maple Hudson., MD

## 2018-05-23 NOTE — Plan of Care (Signed)
  Problem: Education: Goal: Knowledge of General Education information will improve Description Including pain rating scale, medication(s)/side effects and non-pharmacologic comfort measures Outcome: Progressing   Problem: Health Behavior/Discharge Planning: Goal: Ability to manage health-related needs will improve Outcome: Progressing   Problem: Clinical Measurements: Goal: Ability to maintain clinical measurements within normal limits will improve Outcome: Progressing Goal: Will remain free from infection Outcome: Progressing Goal: Diagnostic test results will improve Outcome: Progressing Goal: Respiratory complications will improve Outcome: Progressing Goal: Cardiovascular complication will be avoided Outcome: Progressing   Problem: Nutrition: Goal: Adequate nutrition will be maintained Outcome: Progressing   Problem: Coping: Goal: Level of anxiety will decrease Outcome: Progressing   Problem: Elimination: Goal: Will not experience complications related to bowel motility Outcome: Progressing Goal: Will not experience complications related to urinary retention Outcome: Progressing   Problem: Pain Managment: Goal: General experience of comfort will improve Outcome: Progressing   Problem: Safety: Goal: Ability to remain free from injury will improve Outcome: Progressing   Problem: Skin Integrity: Goal: Risk for impaired skin integrity will decrease Outcome: Progressing   Problem: Education: Goal: Verbalization of understanding the information provided (i.e., activity precautions, restrictions, etc) will improve Outcome: Progressing Goal: Individualized Educational Video(s) Outcome: Progressing   Problem: Clinical Measurements: Goal: Postoperative complications will be avoided or minimized Outcome: Progressing   Problem: Pain Management: Goal: Pain level will decrease Outcome: Progressing

## 2018-05-23 NOTE — Progress Notes (Signed)
Physical Therapy Treatment Patient Details Name: Real ConsFrank S Seger MRN: [id number] DOB: 1929/01/21 Today's Date: 05/23/2018    History of Present Illness [name] Wotton is an 84yo male who [facility] p witnessed mechanical fall at ALF, subsequent hip pain. Pt with 3 falls is past 90 days per daughter. Pt admitted with Right Intertrochanteric Fracture, now s/p Rt intramedullary fixation c Dr. Martha ClanKrasinski, WBAT. AT baseline, pt is largely limited by Parkinsonian freezing episodes, Pt is total assist for ADL, but is able to self feed. Pt requires physical assist for all mobility, but does AMB with supervision.     PT Comments    Pt continues to be very limited but was able to show some minimal participation with supine exercises today.  Daughter was present and though she did not expect pt to be able to fully participate she helped encourage him to participate and was pleased to see that he showed some ability to acknowledge PT and showed some AAROM with basic exercises; most were PROM but he was able to show some intermittent effort with ankle pumps and SAQ.   Follow Up Recommendations  SNF;Supervision/Assistance - 24 hour     Equipment Recommendations  None recommended by PT    Recommendations for Other Services       Precautions / Restrictions Precautions Precautions: Fall Restrictions RLE Weight Bearing: Weight bearing as tolerated    Mobility  Bed Mobility               General bed mobility comments: pt calling out in pain with even minimal R LE movement for supine exercises, deferred mobility  Transfers                    Ambulation/Gait                 Stairs             Wheelchair Mobility    Modified Rankin (Stroke Patients Only)       Balance                                            Cognition Arousal/Alertness: Lethargic Behavior During Therapy: Flat affect Overall Cognitive Status: History of  cognitive impairments - at baseline                                 General Comments: Pt with eyes open some of the time, did not seem to be interested in interacting       Exercises General Exercises - Lower Extremity Ankle Circles/Pumps: PROM;AAROM;10 reps Short Arc Quad: AAROM;PROM;10 reps Heel Slides: PROM;10 reps Hip ABduction/ADduction: PROM;10 reps    General Comments        Pertinent Vitals/Pain Pain Assessment: (unable to rate, clearly in a lot of pain with any movement)    Home Living                      Prior Function            PT Goals (current goals can now be found in the care plan section) Progress towards PT goals: Progressing toward goals    Frequency    BID      PT Plan Current plan remains appropriate    Co-evaluation  AM-PAC PT "6 Clicks" Daily Activity  Outcome Measure  Difficulty turning over in bed (including adjusting bedclothes, sheets and blankets)?: Unable Difficulty moving from lying on back to sitting on the side of the bed? : Unable Difficulty sitting down on and standing up from a chair with arms (e.g., wheelchair, bedside commode, etc,.)?: Unable Help needed moving to and from a bed to chair (including a wheelchair)?: Total Help needed walking in hospital room?: Total Help needed climbing 3-5 steps with a railing? : Total 6 Click Score: 6    End of Session   Activity Tolerance: Patient limited by pain;Patient limited by lethargy Patient left: in bed;with family/visitor present;with call bell/phone within reach Nurse Communication: Mobility status PT Visit Diagnosis: Difficulty in walking, not elsewhere classified (R26.2);Pain;Muscle weakness (generalized) (M62.81) Pain - Right/Left: Right Pain - part of body: Hip     Time: 1610-9604 PT Time Calculation (min) (ACUTE ONLY): 17 min  Charges:  $Therapeutic Exercise: 8-22 mins                     Malachi Pro, DPT 05/23/2018,  5:10 PM

## 2018-05-23 NOTE — Progress Notes (Addendum)
Plan is for patient to D/C to [facility] with palliative care, room 202-B tomorrow pending medical clearance. [facility] is aware of above. Clinical Child psychotherapistocial Worker (CSW) sent D/C summary to [facility] via [facility] today. Patient's son Doreatha MartinSam and daughter Arline AspCindy are at bedside and aware of above. Patient's son and daughter prefer for patient to D/C tomorrow instead of today and requested a palliative care consult. MD and RN aware of above. [facility] is aware of above. Per Ladona Ridgelaylor they have an air mattress for patient. Medicare bundle case manager is aware of above.    [employer] Hughes IncorporatedBailey Riyanshi Wahab, LCSW [phone]

## 2018-05-24 MED ORDER — DOCUSATE SODIUM 100 MG PO CAPS: 200.0000 mg | ORAL_CAPSULE | Freq: Every day | ORAL | 0 refills | Status: DC

## 2018-05-24 MED ORDER — TRAMADOL HCL 50 MG PO TABS: 50.0000 mg | ORAL_TABLET | Freq: Four times a day (QID) | ORAL | 0 refills | Status: DC | PRN

## 2018-05-24 NOTE — Discharge Summary (Signed)
[facility] [place] [facility]   PATIENT NAME: Steven Roach    MR#:  [id number]  DATE OF BIRTH:  [birth year]  DATE OF ADMISSION:  05/20/2018 ADMITTING PHYSICIAN: Evelena Asa Pietrina Jagodzinski, MD  DATE OF DISCHARGE: No discharge date for patient encounter.  PRIMARY CARE PHYSICIAN: Maple Hudson., MD    ADMISSION DIAGNOSIS:  Weakness [R53.1] Closed fracture of right hip, initial encounter (HCC) [S72.001A]  DISCHARGE DIAGNOSIS:  Active Problems:   Hip fx (HCC)   Pressure injury of skin   SECONDARY DIAGNOSIS:   Past Medical History:  Diagnosis Date  . GERD (gastroesophageal reflux disease)   . Parkinson's disease [facility])     HOSPITAL COURSE:  84 year old male with a history of Parkinson's disease who is status post mechanical fall with right hip fracture.  1. Right femoral intertrochanteric fracture: Patient is postoperative day #2 status post intramedullary nailing for right intracolonic hip fracture DVT prophylaxis as per orthopedic surgery with Lovenox. Physical therapy is recommended skilled nursing facility upon discharge  2. Sacral skin changes without obvious wound: Continue incontinent barrier cream  3. Parkinson's disease: Continue Sinemet  4. Depression: Continue Zoloft  5. BPH: Continue Flomax and finasteride  Outpatient palliative care consult   DISCHARGE CONDITIONS:   stable  CONSULTS OBTAINED:    DRUG ALLERGIES:   Allergies  Allergen Reactions  . Celecoxib Other (See Comments)    Other Reaction: GI Upset, gastritis    DISCHARGE MEDICATIONS:   Allergies as of 05/24/2018      Reactions   Celecoxib Other (See Comments)   Other Reaction: GI Upset, gastritis      Medication List    STOP taking these medications   BAZA PROTECT EX   salicyclic acid-sulfur 2-2 % shampoo Commonly known as:  SEBULEX     TAKE these medications   acetaminophen 500 MG tablet Commonly known as:  TYLENOL Take 1  tablet (500MG ) by mouth every night at bedtime as scheduled and 1 tablet every 6 hours as needed for pain   aluminum hydroxide-magnesium carbonate 95-358 MG/15ML Susp Commonly known as:  GAVISCON Take 15 mLs by mouth every evening. For heartburn   aspirin 81 MG tablet Take 81 mg by mouth daily.   atorvastatin 20 MG tablet Commonly known as:  LIPITOR Take 1 tablet (20 mg total) by mouth daily.   carbidopa-levodopa 25-100 MG tablet Commonly known as:  SINEMET IR TAKE 1.5 TABLETS BY MOUTH 3 TIMES DAILY FOR PARKINSON'S   dextromethorphan-guaiFENesin 30-600 MG 12hr tablet Commonly known as:  MUCINEX DM Take 1 tablet by mouth 2 (two) times daily as needed for cough.   docusate sodium 100 MG capsule Commonly known as:  COLACE Take 100 mg by mouth every other day. What changed:  Another medication with the same name was added. Make sure you understand how and when to take each.   docusate sodium 100 MG capsule Commonly known as:  COLACE Take 2 capsules (200 mg total) by mouth daily. What changed:  You were already taking a medication with the same name, and this prescription was added. Make sure you understand how and when to take each.   enoxaparin 40 MG/0.4ML injection Commonly known as:  LOVENOX Inject 0.4 mLs (40 mg total) into the skin daily for 13 days.   finasteride 5 MG tablet Commonly known as:  PROSCAR Take 5 mg by mouth daily.   Melatonin 3 MG Tabs Take 3 mg by mouth every evening.   ondansetron 4  MG tablet Commonly known as:  ZOFRAN Take 4 mg by mouth every 8 (eight) hours as needed for nausea or vomiting.   pantoprazole 40 MG tablet Commonly known as:  PROTONIX Take 40 mg by mouth 2 (two) times daily.   sertraline 100 MG tablet Commonly known as:  ZOLOFT Take 100 mg by mouth daily.   tamsulosin 0.4 MG Caps capsule Commonly known as:  FLOMAX Take 0.4 mg by mouth daily.   traMADol 50 MG tablet Commonly known as:  ULTRAM Take 1 tablet (50 mg total) by  mouth every 6 (six) hours as needed.        DISCHARGE INSTRUCTIONS:  If you experience worsening of your admission symptoms, develop shortness of breath, life threatening emergency, suicidal or homicidal thoughts you must seek medical attention immediately by calling 911 or calling your MD immediately  if symptoms less severe.  You Must read complete instructions/literature along with all the possible adverse reactions/side effects for all the Medicines you take and that have been prescribed to you. Take any new Medicines after you have completely understood and accept all the possible adverse reactions/side effects.   Please note  You were cared for by a hospitalist during your hospital stay. If you have any questions about your discharge medications or the care you received while you were in the hospital after you are discharged, you can call the unit and asked to speak with the hospitalist on call if the hospitalist that took care of you is not available. Once you are discharged, your primary care physician will handle any further medical issues. Please note that NO REFILLS for any discharge medications will be authorized once you are discharged, as it is imperative that you return to your primary care physician (or establish a relationship with a primary care physician if you do not have one) for your aftercare needs so that they can reassess your need for medications and monitor your lab values.    Today   CHIEF COMPLAINT:   Chief Complaint  Patient presents with  . Fall    HISTORY OF PRESENT ILLNESS:  84 y.o. male with a known history per below status post mechanical fall at extended care facility, ER work-up noted for right trochanteric hip fracture, patient is poor historian due to Parkinson's disease, multiple daughters at the bedside, patient now be admitted for acute right intertrochanteric hip fracture VITAL SIGNS:  Blood pressure (!) 113/54, pulse 74, temperature 97.6 F  (36.4 C), temperature source Oral, resp. rate 18, height 5\' 9"  (1.753 m), weight 63.5 kg, SpO2 93 %.  I/O:    Intake/Output Summary (Last 24 hours) at 05/24/2018 0952 Last data filed at 05/23/2018 1015 Gross per 24 hour  Intake 120 ml  Output -  Net 120 ml    PHYSICAL EXAMINATION:  GENERAL:  84 y.o.-year-old patient lying in the bed with no acute distress.  EYES: Pupils equal, round, reactive to light and accommodation. No scleral icterus. Extraocular muscles intact.  HEENT: Head atraumatic, normocephalic. Oropharynx and nasopharynx clear.  NECK:  Supple, no jugular venous distention. No thyroid enlargement, no tenderness.  LUNGS: Normal breath sounds bilaterally, no wheezing, rales,rhonchi or crepitation. No use of accessory muscles of respiration.  CARDIOVASCULAR: S1, S2 normal. No murmurs, rubs, or gallops.  ABDOMEN: Soft, non-tender, non-distended. Bowel sounds present. No organomegaly or mass.  EXTREMITIES: No pedal edema, cyanosis, or clubbing.  NEUROLOGIC: Cranial nerves II through XII are intact. Muscle strength 5/5 in all extremities. Sensation intact. Gait not  checked.  PSYCHIATRIC: The patient is alert and oriented x 3.  SKIN: No obvious rash, lesion, or ulcer.   DATA REVIEW:   CBC Recent Labs  Lab 05/23/18 0650  WBC 11.1*  HGB 10.3*  HCT 30.6*  PLT 182    Chemistries  Recent Labs  Lab 05/23/18 0650  NA 141  K 3.9  CL 103  CO2 25  GLUCOSE 127*  BUN 16  CREATININE 0.79  CALCIUM 8.3*    Cardiac Enzymes No results for input(s): TROPONINI in the last 168 hours.  Microbiology Results  Results for orders placed or performed during the hospital encounter of 05/20/18  Surgical pcr screen     Status: None   Collection Time: 05/20/18  8:53 PM  Result Value Ref Range Status   MRSA, PCR NEGATIVE NEGATIVE Final   Staphylococcus aureus NEGATIVE NEGATIVE Final    Comment: (NOTE) The Xpert SA Assay (FDA approved for NASAL specimens in patients 82 years of age  and older), is one component of a comprehensive surveillance program. It is not intended to diagnose infection nor to guide or monitor treatment. Performed at [facility], [address] [place], [place] [address]     RADIOLOGY:  No results found.  EKG:   Orders placed or performed during the hospital encounter of 05/20/18  . ED EKG  . ED EKG  . EKG 12-Lead  . EKG 12-Lead  . EKG 12-Lead  . EKG 12-Lead      Management plans discussed with the patient, family and they are in agreement.  CODE STATUS:     Code Status Orders  (From admission, onward)         Start     Ordered   05/20/18 2208  Do not attempt resuscitation (DNR)  Continuous    Question Answer Comment  In the event of cardiac or respiratory ARREST Do not call a "code blue"   In the event of cardiac or respiratory ARREST Do not perform Intubation, CPR, defibrillation or ACLS   In the event of cardiac or respiratory ARREST Use medication by any route, position, wound care, and other measures to relive pain and suffering. May use oxygen, suction and manual treatment of airway obstruction as needed for comfort.   Comments nurse may pronounce      05/20/18 2209        Code Status History    Date Active Date Inactive Code Status Order ID Comments User Context   05/20/2018 2012 05/20/2018 2209 DNR [id number]  Tonie Elsey, Evelena AsaMontell D, MD ED    Advance Directive Documentation     Most Recent Value  Type of Advance Directive  Healthcare Power of Attorney  Pre-existing out of facility DNR order (yellow form or pink MOST form)  Yellow form placed in chart (order not valid for inpatient use)  "MOST" Form in Place?  -      TOTAL TIME TAKING CARE OF THIS PATIENT: 45 minutes.    Evelena AsaMontell D Nyia Tsao M.D on 05/24/2018 at 9:52 AM  Between 7am to 6pm - Pager - [phone]  After 6pm go to [email or web address] - password [email or web address] [facility]  [facility]  Office  [phone]  CC: Primary care physician;  Maple HudsonGilbert, Richard L Jr., MD   Note: This dictation was prepared with Dragon dictation along with smaller phrase technology. Any transcriptional errors that result from this process are unintentional.

## 2018-05-24 NOTE — Progress Notes (Signed)
At shift change family was present at bedside. Son and daughter were there. They requested that after patient fell asleep they did not want him touched. Patient was given evening meds, vitals signs obtained and patient was checked for soiling. Patient was dry. Tech entered room around 2230 to check on patient and daughter motioned for her to not come in and stated "we are fine." Family states they will call and do not want staff to come in to bother patient unless called.

## 2018-05-24 NOTE — Progress Notes (Signed)
Lab here to draw morning blood work. Daughter at bedside requesting that they come back when patient is awake. Lab will return after shift change.

## 2018-05-24 NOTE — Progress Notes (Signed)
Physical Therapy Treatment Patient Details Name: Steven Roach MRN: [id number] DOB: 1929/06/13 Today's Date: 05/24/2018    History of Present Illness Steven Roach Steven Roach is an 84yo male [facility] p witnessed mechanical fall at ALF, subsequent hip pain. Pt with 3 falls is past 90 days per daughter. Pt admitted with Right Intertrochanteric Fracture, now s/p Rt intramedullary fixation c Dr. Martha ClanKrasinski, WBAT. AT baseline, pt is largely limited by Parkinsonian freezing episodes, Pt is total assist for IADLs and bathing/dressing, but is able to self feed and perform grooming/hygiene tasks. Pt requires physical assist for all mobility, but does AMB with supervision with 4WW.     PT Comments    Participated in exercises as described below.  Pt inc of urine.  Assisted nurse tech with care.  Max a x 2 for rolling and combative at times due to pain.  Further mobility deferred at this time.  Follow Up Recommendations  SNF     Equipment Recommendations       Recommendations for Other Services       Precautions / Restrictions Precautions Precautions: Fall Restrictions Weight Bearing Restrictions: No RLE Weight Bearing: Weight bearing as tolerated Other Position/Activity Restrictions: combative at times especially when in pain/movement    Mobility  Bed Mobility Overal bed mobility: Needs Assistance Bed Mobility: Rolling Rolling: Max assist   Supine to sit: +2 for physical assistance;Mod assist;Max assist     General bed mobility comments: deferred sitting as pt was being prepped for transfer.  Transfers                 General transfer comment: t/f's not appropriate at this time d/t pt's pain level and level of lethargy.  Ambulation/Gait                 Stairs             Wheelchair Mobility    Modified Rankin (Stroke Patients Only)       Balance Overall balance assessment: Needs assistance Sitting-balance support: Bilateral upper  extremity supported;Feet supported Sitting balance-Leahy Scale: Poor Sitting balance - Comments: Pt with increased eye opening while sitting up, OT facilitated pt engaging in holding himself up d/t initially requiring total assistance.                                     Cognition Arousal/Alertness: Awake/alert Behavior During Therapy: Flat affect Overall Cognitive Status: History of cognitive impairments - at baseline                                 General Comments: Pt with eyes open some of the time, increased verbal communication per son, able to verbalize "help me", able to state he was in hospital with moderate cues. Still primarily lethargic.       Exercises Other Exercises Other Exercises: LLE AAROM for ankle pumps, heels slides, ab/add and SLR.  x 10 Other Exercises: rolling for care with nurse tech Other Exercises: OT provides education about in-bed positioning to prevent skin breakdown (floating heels), son verbalized understanding.     General Comments        Pertinent Vitals/Pain Pain Assessment: Faces Pain Score: 8  Pain Location: R hip - with rolling for care Pain Descriptors / Indicators: Moaning;Contraction;Operative site guarding;Other (Comment) Pain Intervention(s): Limited activity within patient's tolerance;Monitored during  session    Home Living Family/patient expects to be discharged to:: Assisted living             Home Equipment: Walker - 4 wheels Additional Comments: Has been working with PT prior to fall.     Prior Function Level of Independence: Needs assistance  Gait / Transfers Assistance Needed: Household distances with [phone], multiple falls (3 in past 90 days); Per pt's son, pt requires minimal physical assistance with getting out of a chair and out of bed at baseline ADL's / Homemaking Assistance Needed: Pt was able to perform UB ADLs including self feeding and grooming with exception of shaving d/t shaking hands,  pt requires total A with all IADLs as well as bathing and dressing d/t parkinsonian freezing episodes, able to communicate bathroom needs to caregivers at ALF Comments: gradually increasing confusion and disorientation to place and or situation per daugter.    PT Goals (current goals can now be found in the care plan section) Acute Rehab PT Goals Patient Stated Goal: ideally the goal is to return to ALF and be able to walk per son Progress towards PT goals: Progressing toward goals    Frequency    BID      PT Plan Current plan remains appropriate    Co-evaluation              AM-PAC PT "6 Clicks" Daily Activity  Outcome Measure  Difficulty turning over in bed (including adjusting bedclothes, sheets and blankets)?: Unable Difficulty moving from lying on back to sitting on the side of the bed? : Unable Difficulty sitting down on and standing up from a chair with arms (e.g., wheelchair, bedside commode, etc,.)?: Unable Help needed moving to and from a bed to chair (including a wheelchair)?: Total Help needed walking in hospital room?: Total Help needed climbing 3-5 steps with a railing? : Total 6 Click Score: 6    End of Session   Activity Tolerance: Patient limited by pain;Treatment limited secondary to agitation Patient left: in bed;with bed alarm set;with call bell/phone within reach;with family/visitor present;with nursing/sitter in room   Pain - Right/Left: Right Pain - part of body: Hip     Time: 1144-1200 PT Time Calculation (min) (ACUTE ONLY): 16 min  Charges:  $Therapeutic Exercise: 8-22 mins                     Danielle Dess, PTA 05/24/18, 12:12 PM

## 2018-05-24 NOTE — Clinical Social Work Note (Signed)
The patient will [facility] today [facility] 202-B via non-emergent EMS. The patient and his family are aware and in agreement as is the facility. The CSW has sent documentation to the facility and has delivered the discharge packet. The CSW will sign off. Please consult should additional needs arise.  Argentina PonderKaren Martha Sunday Klos, MSW, Theresia MajorsLCSWA [phone]

## 2018-05-24 NOTE — Progress Notes (Signed)
EMS here to transport patient

## 2018-05-24 NOTE — Evaluation (Signed)
Occupational Therapy Evaluation Patient Details Name: [name] MRN: [id number] DOB: 11/03/1928 Today's Date: 05/24/2018    History of Present Illness Steven Roach is an 84yo male who comes to [facility] on 8/6 p witnessed mechanical fall at ALF, subsequent hip pain. Pt with 3 falls is past 90 days per daughter. Pt admitted with Right Intertrochanteric Fracture, now s/p Rt intramedullary fixation c Dr. Martha ClanKrasinski, WBAT. AT baseline, pt is largely limited by Parkinsonian freezing episodes, Pt is total assist for IADLs and bathing/dressing, but is able to self feed and perform grooming/hygiene tasks. Pt requires physical assist for all mobility, but does AMB with supervision with 4WW.    Clinical Impression   Pt seen for OT evaluation this date after attempts in previous days in which eval could not be completed secondary to lethargy. Pt presents with some lethargy this date as well, but is better able to communicate needs and has some increased eye opening/attention. Prior to hospital admission, pt was living in ALF requiring intermittent MIN A for transfers and supervision for ambulation with 4WW per pt's son.  D/t decreased coordination, pt was requiring total dependence for bathing and dressing.  Currently pt demonstrates impairments in self feeding and UB ADLs for which he could previously participate, he's requiring MAX to total dependent assist for ADLs and  MOD/MAX X2 for sup<>sit. OT provides education to son who is present regarding role of OT and OT POC as well as benefits to encouraging mobility and safe use of bed rails for bed mobility. Son verbalized understanding. Pt would benefit from skilled OT to address noted impairments and functional limitations (see below for any additional details) in order to maximize safety and independence while minimizing falls risk and caregiver burden.  Upon hospital discharge, recommend pt discharge to SNF.    Follow Up Recommendations  SNF     Equipment Recommendations  Other (comment)(TBD)    Recommendations for Other Services       Precautions / Restrictions Precautions Precautions: Fall Restrictions Weight Bearing Restrictions: No RLE Weight Bearing: Weight bearing as tolerated      Mobility Bed Mobility Overal bed mobility: Needs Assistance Bed Mobility: Supine to Sit     Supine to sit: +2 for physical assistance;Mod assist;Max assist     General bed mobility comments: OT primarily performed sup<>sit but son providing support behind pt's back for safety and for pt to feel more secure  Transfers                 General transfer comment: t/f's not appropriate at this time d/t pt's pain level and level of lethargy.    Balance Overall balance assessment: Needs assistance Sitting-balance support: Bilateral upper extremity supported;Feet supported Sitting balance-Leahy Scale: Poor Sitting balance - Comments: Pt with increased eye opening while sitting up, OT facilitated pt engaging in holding himself up d/t initially requiring total assistance.                                    ADL either performed or assessed with clinical judgement   ADL Overall ADL's : Needs assistance/impaired Eating/Feeding: Maximal assistance;Bed level   Grooming: Wash/dry face;Maximal assistance;Bed level                                       Vision Baseline Vision/History: Wears glasses Wears  Glasses: At all times Patient Visual Report: No change from baseline Additional Comments: minimal eye opening, information about vision provided by family member present-son     Perception     Praxis      Pertinent Vitals/Pain Pain Assessment: Faces Pain Score: 7  Pain Location: R hip Pain Descriptors / Indicators: Moaning;Contraction;Operative site guarding Pain Intervention(s): Limited activity within patient's tolerance;Monitored during session     Hand Dominance Right   Extremity/Trunk  Assessment Upper Extremity Assessment Upper Extremity Assessment: Generalized weakness;RUE deficits/detail;LUE deficits/detail RUE Deficits / Details: 3-/5 shld flex and abd, grip 4/5 LUE Deficits / Details: 3-/5 shld flex and abd, grip 4/5   Lower Extremity Assessment Lower Extremity Assessment: Defer to PT evaluation;Generalized weakness;RLE deficits/detail;LLE deficits/detail RLE: Unable to fully assess due to pain LLE: Unable to fully assess due to pain       Communication Communication Communication: HOH   Cognition Arousal/Alertness: Lethargic Behavior During Therapy: Flat affect Overall Cognitive Status: History of cognitive impairments - at baseline                                 General Comments: Pt with eyes open some of the time, increased verbal communication per son, able to verbalize "help me", able to state he was in hospital with moderate cues. Still primarily lethargic.    General Comments       Exercises Other Exercises Other Exercises: Pt's son educated on role of OT and OT POC, son verbalized understanding.  Other Exercises: OT provides education about use of bedrails for bed mobility with son confirming understanding, reinforcement required for pt.  Other Exercises: OT provides education about in-bed positioning to prevent skin breakdown (floating heels), son verbalized understanding.    Shoulder Instructions      Home Living Family/patient expects to be discharged to:: Assisted living                             Home Equipment: Walker - 4 wheels   Additional Comments: Has been working with PT prior to fall.       Prior Functioning/Environment Level of Independence: Needs assistance  Gait / Transfers Assistance Needed: Household distances with [phone], multiple falls (3 in past 90 days); Per pt's son, pt requires minimal physical assistance with getting out of a chair and out of bed at baseline ADL's / Homemaking Assistance  Needed: Pt was able to perform UB ADLs including self feeding and grooming with exception of shaving d/t shaking hands, pt requires total A with all IADLs as well as bathing and dressing d/t parkinsonian freezing episodes, able to communicate bathroom needs to caregivers at ALF Communication / Swallowing Assistance Needed: Son reports pt with clear communication, able to have a conversation at baseline Comments: gradually increasing confusion and disorientation to place and or situation per daugter.         OT Problem List: Decreased strength;Decreased range of motion;Decreased activity tolerance;Impaired balance (sitting and/or standing);Decreased coordination;Decreased safety awareness;Decreased knowledge of use of DME or AE;Pain      OT Treatment/Interventions: Self-care/ADL training;Therapeutic exercise;Energy conservation;DME and/or AE instruction;Therapeutic activities;Balance training;Patient/family education    OT Goals(Current goals can be found in the care plan section) Acute Rehab OT Goals Patient Stated Goal: ideally the goal is to return to ALF and be able to walk per son OT Goal Formulation: With patient/family Time For Goal Achievement: Jun 12, 2018 Potential  to Achieve Goals: Fair ADL Goals Pt Will Perform Grooming: bed level;with mod assist(to brush teeth or comb hair) Additional ADL Goal #1: Pt will tolerate EOB sitting x4 mins with MOD A with use of UEs for seated support.  OT Frequency: Min 1X/week   Barriers to D/C:            Co-evaluation              AM-PAC PT "6 Clicks" Daily Activity     Outcome Measure Help from another person eating meals?: A Lot Help from another person taking care of personal grooming?: A Lot Help from another person toileting, which includes using toliet, bedpan, or urinal?: Total Help from another person bathing (including washing, rinsing, drying)?: Total Help from another person to put on and taking off regular upper body  clothing?: A Lot Help from another person to put on and taking off regular lower body clothing?: Total 6 Click Score: 9   End of Session Nurse Communication: Mobility status  Activity Tolerance: Patient limited by lethargy;Patient limited by pain Patient left: in bed;with call bell/phone within reach;with bed alarm set;with family/visitor present;with SCD's reapplied  OT Visit Diagnosis: Unsteadiness on feet (R26.81);Repeated falls (R29.6);Muscle weakness (generalized) (M62.81)                Time: 1610-9604 OT Time Calculation (min): 31 min Charges:  OT Evaluation $OT Eval Moderate Complexity: 1 Mod OT Treatments $Self Care/Home Management : 8-22 mins $Therapeutic Activity: 8-22 mins  Rejeana Brock, MS, OTR/L ascom [phone] or [phone] 05/24/18, 9:58 AM

## 2018-05-24 NOTE — Progress Notes (Signed)
Lab here to draw blood work. Son asked them to return later. Patient sleeping. Lab will return in a couple of hours to see if he is awake.

## 2018-05-24 NOTE — Progress Notes (Signed)
EMS called

## 2018-05-24 NOTE — Progress Notes (Signed)
[facility] is being [facility]. Report called to [place]. IV removed. DC papers printed and placed with EMS packet.

## 2018-05-24 NOTE — Progress Notes (Signed)
PT Cancellation Note  Patient Details Name: Real ConsFrank S Bugh MRN: [id number] DOB: 03-Nov-1928   Cancelled Treatment:    Reason Eval/Treat Not Completed: Other (comment)   Session offered this am.  Pt's son in and stated he was still fatigued from activity with OT this am.  He was able to sit EOB for a short period of time.  Requested return later.  Pt with discharge orders to transfer to rehab today.  Will continue as appropriate.   Danielle DessSarah Jaymes Revels 05/24/2018, 10:20 AM

## 2018-05-26 ENCOUNTER — Encounter: Payer: Self-pay | Admitting: Adult Health

## 2018-05-26 ENCOUNTER — Other Ambulatory Visit: Payer: Self-pay

## 2018-05-26 ENCOUNTER — Non-Acute Institutional Stay (SKILLED_NURSING_FACILITY): Payer: Medicare Other | Admitting: Adult Health

## 2018-05-26 DIAGNOSIS — B370 Candidal stomatitis: Secondary | ICD-10-CM

## 2018-05-26 DIAGNOSIS — S72001S Fracture of unspecified part of neck of right femur, sequela: Secondary | ICD-10-CM

## 2018-05-26 DIAGNOSIS — F329 Major depressive disorder, single episode, unspecified: Secondary | ICD-10-CM

## 2018-05-26 MED ORDER — TRAMADOL HCL 50 MG PO TABS: 50.0000 mg | ORAL_TABLET | Freq: Four times a day (QID) | ORAL | 0 refills | Status: DC | PRN

## 2018-05-26 NOTE — Progress Notes (Signed)
Location:   [facility] Room Number: 202B Place of Service:  SNF (31)   CODE STATUS: DNR  Allergies  Allergen Reactions  . Celecoxib Other (See Comments)    Other Reaction: GI Upset, gastritis    Chief Complaint  Patient presents with  . Acute Visit    Pain Management    HPI:  I have been asked to see him for right hip pain management. He is unable to participate in the hpi or ros at this time. He is aware of his surroundings; he does have pain in the right hip and leg with palpation. He will not move either lower extremity. He does grimace when he does attempt to move. His daughter is present at bedside. She tells me that he has been suffering from depression since his wife died 18 months ago. She does realize that more than likely he will not have a good recovery. He is not eating or drinking. There are no reports of fever present.    Past Medical History:  Diagnosis Date  . GERD (gastroesophageal reflux disease)   . Parkinson's disease [facility])     Past Surgical History:  Procedure Laterality Date  . CARDIAC SURGERY     bypass-at [facility]  . CATARACT EXTRACTION, BILATERAL    . INTRAMEDULLARY (IM) NAIL INTERTROCHANTERIC Right 05/21/2018   Procedure: INTRAMEDULLARY (IM) NAIL INTERTROCHANTRIC;  Surgeon: Juanell Fairly, MD;  Location: [facility] ORS;  Service: Orthopedics;  Laterality: Right;    Social History   Socioeconomic History  . Marital status: Widowed    Spouse name: Not on file  . Number of children: 2  . Years of education: Not on file  . Highest education level: 12th grade  Occupational History  . Not on file  Social Needs  . Financial resource strain: Not hard at all  . Food insecurity:    Worry: Never true    Inability: Never true  . Transportation needs:    Medical: No    Non-medical: No  Tobacco Use  . Smoking status: Former Smoker    Packs/day: 1.00    Years: 15.00    Pack years: 15.00    Types: Cigarettes    Last attempt to  quit: 10/16/1963    Years since quitting: 54.6  . Smokeless tobacco: Never Used  Substance and Sexual Activity  . Alcohol use: No  . Drug use: No  . Sexual activity: Never  Lifestyle  . Physical activity:    Days per week: Not on file    Minutes per session: Not on file  . Stress: Very much  Relationships  . Social connections:    Talks on phone: Not on file    Gets together: Not on file    Attends religious service: Not on file    Active member of club or organization: Not on file    Attends meetings of clubs or organizations: Not on file    Relationship status: Not on file  . Intimate partner violence:    Fear of current or ex partner: Not on file    Emotionally abused: Not on file    Physically abused: Not on file    Forced sexual activity: Not on file  Other Topics Concern  . Not on file  Social History Narrative  . Not on file   Family History  Problem Relation Age of Onset  . Diabetes Mother   . Breast cancer Mother   . Heart attack Sister   .  Diabetes Brother       VITAL SIGNS BP (!) 130/47   Pulse 74   Temp 98 F (36.7 C) (Oral)   Resp 18   Ht 5\' 9"  (1.753 m)   Wt 143 lb 12.8 oz (65.2 kg)   SpO2 96%   BMI 21.24 kg/m   Outpatient Encounter Medications as of 05/26/2018  Medication Sig Note  . acetaminophen (TYLENOL) 500 MG tablet Take 1 tablet (500MG ) by mouth every night at bedtime as scheduled and 1 tablet every 6 hours as needed for pain   . alum & mag hydroxide-simeth (MAALOX PLUS) 400-400-40 MG/5ML suspension Take 15 mLs by mouth daily.   Marland Kitchen. aspirin 81 MG chewable tablet Chew 81 mg by mouth daily.   Marland Kitchen. atorvastatin (LIPITOR) 20 MG tablet Take 1 tablet (20 mg total) by mouth daily.   . carbidopa-levodopa (SINEMET IR) 25-100 MG tablet TAKE 1.5 TABLETS BY MOUTH 3 TIMES DAILY FOR PARKINSON'S   . Cholecalciferol (HM VITAMIN D3) 4000 units CAPS Take 1 capsule by mouth daily.   Marland Kitchen. dextromethorphan-guaiFENesin (MUCINEX DM) 30-600 MG 12hr tablet Take 1 tablet  by mouth 2 (two) times daily as needed for cough.   . docusate sodium (COLACE) 100 MG capsule Take 100 mg by mouth every other day.   . enoxaparin (LOVENOX) 40 MG/0.4ML injection Inject 0.4 mLs (40 mg total) into the skin daily for 13 days.   . finasteride (PROSCAR) 5 MG tablet Take 5 mg by mouth daily.    . Nutritional Supplements (ENSURE ENLIVE PO) Take 1 Bottle by mouth 2 (two) times daily between meals. Likes strawberry and vanilla   . ondansetron (ZOFRAN) 4 MG tablet Take 4 mg by mouth every 8 (eight) hours as needed for nausea or vomiting.   . pantoprazole (PROTONIX) 40 MG tablet Take 40 mg by mouth 2 (two) times daily.    Marland Kitchen. senna (SENOKOT) 8.6 MG TABS tablet Take 2 tablets by mouth 2 (two) times daily.   . tamsulosin (FLOMAX) 0.4 MG CAPS capsule Take 0.4 mg by mouth daily.    . traMADol (ULTRAM) 50 MG tablet Take 1 tablet (50 mg total) by mouth every 6 (six) hours as needed.   Marland Kitchen. UNABLE TO FIND Diet Type: Regular   . [DISCONTINUED] aluminum hydroxide-magnesium carbonate (GAVISCON) 95-358 MG/15ML SUSP Take 15 mLs by mouth every evening. For heartburn   . [DISCONTINUED] aspirin 81 MG tablet Take 81 mg by mouth daily.   . [DISCONTINUED] docusate sodium (COLACE) 100 MG capsule Take 2 capsules (200 mg total) by mouth daily.   . [DISCONTINUED] Melatonin 3 MG TABS Take 3 mg by mouth every evening. 05/20/2018: MAR has instructions of PRN but appears to be taking regularly   . [DISCONTINUED] sertraline (ZOLOFT) 100 MG tablet Take 100 mg by mouth daily.     No facility-administered encounter medications on file as of 05/26/2018.      SIGNIFICANT DIAGNOSTIC EXAMS  LABS REVIEWED TODAY:   05-23-18: wbc 11.1 hgb 10.3; hct 30.6; mcv 93.6; plt 182; glucose 127; bun 16; creat 0.79; k+ 3.9; na++ 141   Review of Systems  Unable to perform ROS: Medical condition    Physical Exam  Constitutional: No distress.  Frail   HENT:  Has oral thrush   Neck: No thyromegaly present.  Cardiovascular: Normal  rate, regular rhythm, normal heart sounds and intact distal pulses.  Pulmonary/Chest: Effort normal and breath sounds normal. No respiratory distress.  Abdominal: Soft. Bowel sounds are normal. He exhibits no distension.  There is no tenderness.  Musculoskeletal: He exhibits no edema.  Does not move lower extremities   Lymphadenopathy:    He has no cervical adenopathy.  Neurological:  Is aware   Skin: Skin is warm and dry. He is not diaphoretic.  Right hip incision line without signs of infection      ASSESSMENT/ PLAN:  TODAY ;  1. Right hip fracture, sequela 2. Oral thrush:  3. Major chronic depressive disorder  Will increase his zoloft to 200 mg daily  Will begin diflucan 150 mg daily for 7 days Will change tylenol to 650 mg every 6 hours Will change ultram to 50 mg every 6 hours and will monitor his status.     MD is aware of resident's narcotic use and is in agreement with current plan of care. We will attempt to wean resident as apropriate   Synthia Innocenteborah Keiyon Plack NP [facility]  Contact [phone] Monday through Friday 8am- 5pm  After hours call [phone]

## 2018-05-26 NOTE — Telephone Encounter (Signed)
Rx sent to [facility] phone : [phone] , fax : [phone]  

## 2018-05-28 ENCOUNTER — Encounter: Payer: Self-pay | Admitting: Adult Health

## 2018-05-28 ENCOUNTER — Non-Acute Institutional Stay (SKILLED_NURSING_FACILITY): Payer: Medicare Other | Admitting: Adult Health

## 2018-05-28 ENCOUNTER — Other Ambulatory Visit: Payer: Self-pay

## 2018-05-28 DIAGNOSIS — S72001S Fracture of unspecified part of neck of right femur, sequela: Secondary | ICD-10-CM | POA: Diagnosis not present

## 2018-05-28 DIAGNOSIS — F329 Major depressive disorder, single episode, unspecified: Secondary | ICD-10-CM | POA: Diagnosis not present

## 2018-05-28 DIAGNOSIS — G20 Parkinson's disease: Secondary | ICD-10-CM

## 2018-05-28 NOTE — Progress Notes (Signed)
Location:   [facility] Room Number: 202B Place of Service:  SNF (31)    CODE STATUS: DNR  Allergies  Allergen Reactions  . Celecoxib Other (See Comments)    Other Reaction: GI Upset, gastritis    Chief Complaint  Patient presents with  . Discharge Note    Discharging  05/28/2018 to Hospice     HPI:  He is being discharged to the hospice home. He will not need home health; the hospice house will provide all needed dme. He had been hospitalized for a right hip fracture. He was admitted to this facility for short term rehab. He has continued to decline since his admission to this facility. His status is terminal; his family has decided to move him to hospice for comfort measures.    Past Medical History:  Diagnosis Date  . GERD (gastroesophageal reflux disease)   . Parkinson's disease [facility](H[facility])     Past Surgical History:  Procedure Laterality Date  . CARDIAC SURGERY     bypass-at [facility]  . CATARACT EXTRACTION, BILATERAL    . INTRAMEDULLARY (IM) NAIL INTERTROCHANTERIC Right 05/21/2018   Procedure: INTRAMEDULLARY (IM) NAIL INTERTROCHANTRIC;  Surgeon: Juanell FairlyKrasinski, Kevin, MD;  Location: [facility] ORS;  Service: Orthopedics;  Laterality: Right;    Social History   Socioeconomic History  . Marital status: Widowed    Spouse name: Not on file  . Number of children: 2  . Years of education: Not on file  . Highest education level: 12th grade  Occupational History  . Not on file  Social Needs  . Financial resource strain: Not hard at all  . Food insecurity:    Worry: Never true    Inability: Never true  . Transportation needs:    Medical: No    Non-medical: No  Tobacco Use  . Smoking status: Former Smoker    Packs/day: 1.00    Years: 15.00    Pack years: 15.00    Types: Cigarettes    Last attempt to quit: 10/16/1963    Years since quitting: 54.6  . Smokeless tobacco: Never Used  Substance and Sexual Activity  . Alcohol use: No  . Drug use: No  .  Sexual activity: Never  Lifestyle  . Physical activity:    Days per week: Not on file    Minutes per session: Not on file  . Stress: Very much  Relationships  . Social connections:    Talks on phone: Not on file    Gets together: Not on file    Attends religious service: Not on file    Active member of club or organization: Not on file    Attends meetings of clubs or organizations: Not on file    Relationship status: Not on file  . Intimate partner violence:    Fear of current or ex partner: Not on file    Emotionally abused: Not on file    Physically abused: Not on file    Forced sexual activity: Not on file  Other Topics Concern  . Not on file  Social History Narrative  . Not on file   Family History  Problem Relation Age of Onset  . Diabetes Mother   . Breast cancer Mother   . Heart attack Sister   . Diabetes Brother     VITAL SIGNS BP 131/90   Pulse 63   Temp 98.2 F (36.8 C) (Oral)   Resp 18   Ht 5\' 9"  (1.753 m)   Wt  143 lb 12.8 oz (65.2 kg)   SpO2 98%   BMI 21.24 kg/m   Patient's Medications  New Prescriptions   No medications on file  Previous Medications   ACETAMINOPHEN (TYLENOL) 325 MG TABLET    Take 650 mg by mouth every 6 (six) hours.   ALUM & MAG HYDROXIDE-SIMETH (MAALOX PLUS) 400-400-40 MG/5ML SUSPENSION    Take 15 mLs by mouth daily.   ASPIRIN 81 MG CHEWABLE TABLET    Chew 81 mg by mouth daily.   ATORVASTATIN (LIPITOR) 20 MG TABLET    Take 1 tablet (20 mg total) by mouth daily.   CARBIDOPA-LEVODOPA (SINEMET IR) 25-100 MG TABLET    TAKE 1.5 TABLETS BY MOUTH 3 TIMES DAILY FOR PARKINSON'S   CHOLECALCIFEROL (HM VITAMIN D3) 4000 UNITS CAPS    Take 1 capsule by mouth daily.   DEXTROMETHORPHAN-GUAIFENESIN (MUCINEX DM) 30-600 MG 12HR TABLET    Take 1 tablet by mouth 2 (two) times daily as needed for cough.   DOCUSATE SODIUM (COLACE) 100 MG CAPSULE    Take 100 mg by mouth every other day.   ENOXAPARIN (LOVENOX) 40 MG/0.4ML INJECTION    Inject 0.4 mLs (40  mg total) into the skin daily for 13 days.   FINASTERIDE (PROSCAR) 5 MG TABLET    Take 5 mg by mouth daily.    FLUCONAZOLE (DIFLUCAN) 100 MG TABLET    Take 100 mg by mouth daily.   NUTRITIONAL SUPPLEMENTS (ENSURE ENLIVE PO)    Take 1 Bottle by mouth 2 (two) times daily between meals. Likes strawberry and vanilla   ONDANSETRON (ZOFRAN) 4 MG TABLET    Take 4 mg by mouth every 8 (eight) hours as needed for nausea or vomiting.   OXYCODONE-ACETAMINOPHEN (PERCOCET/ROXICET) 5-325 MG TABLET    Take 1-2 tablets by mouth every 4 (four) hours as needed for severe pain.   PANTOPRAZOLE (PROTONIX) 40 MG TABLET    Take 40 mg by mouth 2 (two) times daily.    SENNA (SENOKOT) 8.6 MG TABS TABLET    Take 2 tablets by mouth 2 (two) times daily.   SERTRALINE (ZOLOFT) 100 MG TABLET    Take 200 mg by mouth daily.   SKIN PROTECTANTS, MISC. (ENDIT EX)    Apply 1 application topically 2 (two) times daily. apply to irritated skin of sacral area and buttocks BID and PRN after incontinence   TAMSULOSIN (FLOMAX) 0.4 MG CAPS CAPSULE    Take 0.4 mg by mouth daily.    TRAMADOL (ULTRAM) 50 MG TABLET    Take 50 mg by mouth every 6 (six) hours.   UNABLE TO FIND    Diet Type: Regular  Modified Medications   No medications on file  Discontinued Medications   ACETAMINOPHEN (TYLENOL) 500 MG TABLET    Take 1 tablet (500MG ) by mouth every night at bedtime as scheduled and 1 tablet every 6 hours as needed for pain   TRAMADOL (ULTRAM) 50 MG TABLET    Take 1 tablet (50 mg total) by mouth every 6 (six) hours as needed.     SIGNIFICANT DIAGNOSTIC EXAMS   LABS REVIEWED TODAY:   05-23-18: wbc 11.1 hgb 10.3; hct 30.6; mcv 93.6; plt 182; glucose 127; bun 16; creat 0.79; k+ 3.9; na++ 141  NO NEW LABS.    Review of Systems  Unable to perform ROS: Medical condition   Physical Exam  Constitutional: No distress.  Frail   HENT:  Oral thrush   Neck: No thyromegaly present.  Cardiovascular: Normal  rate, regular rhythm, normal heart sounds  and intact distal pulses.  Pulmonary/Chest: Effort normal and breath sounds normal. No respiratory distress.  Abdominal: Soft. Bowel sounds are normal. He exhibits no distension. There is no tenderness.  Musculoskeletal: He exhibits no edema.  Does not move lower extremities   Lymphadenopathy:    He has no cervical adenopathy.  Neurological:  Is aware   Skin: Skin is warm and dry. He is not diaphoretic.  Right hip incision line without signs of infection present      ASSESSMENT/ PLAN:   Patient is being discharged with the following home health services:  None needed   Patient is being discharged with the following durable medical equipment:  None needed   Patient has been advised to f/u with their PCP in 1-2 weeks to bring them up to date on their rehab stay.  Social services at facility was responsible for arranging this appointment.  Pt was provided with a 30 day supply of prescriptions for medications and refills must be obtained from their PCP.  For controlled substances, a more limited supply may be provided adequate until PCP appointment only.  A 30 day supply of his prescription medications have been written as listed above #60 percocet 5/325mg  #120 ultram 50 mg   Time spent with family: 35 minutes: discussed medications; expectations upon discharge; verbalized understanding.    Synthia Innocenteborah Dorna Mallet NP [facility]  Contact [phone] Monday through Friday 8am- 5pm  After hours call [phone]

## 2018-05-28 NOTE — Telephone Encounter (Signed)
Patient will receive prescriptions for Tramadol 50 mg and Percocet 5/325 mg tab upon discharge. Tramadol 50 mg tab 1 po q6h 120 tabs total and Percocet 5/325 mg tab 1 - 2 po q4h prn total 60 tabs.

## 2018-06-02 ENCOUNTER — Inpatient Hospital Stay: Payer: Medicare Other | Admitting: Family Medicine

## 2018-06-04 DIAGNOSIS — B370 Candidal stomatitis: Secondary | ICD-10-CM | POA: Insufficient documentation

## 2018-06-09 ENCOUNTER — Telehealth: Payer: Self-pay | Admitting: Neurology

## 2018-06-09 ENCOUNTER — Telehealth: Payer: Self-pay | Admitting: Family Medicine

## 2018-06-09 NOTE — Telephone Encounter (Signed)
Patient's daughter Arline AspCindy called to advise patient passed away on Oct 14, 2018.

## 2018-06-15 DEATH — deceased

## 2018-06-30 ENCOUNTER — Ambulatory Visit: Payer: Medicare Other | Admitting: Neurology

## 2018-09-01 ENCOUNTER — Ambulatory Visit: Payer: Self-pay | Admitting: Family Medicine

## 2018-10-31 ENCOUNTER — Telehealth: Payer: Self-pay

## 2018-10-31 NOTE — Telephone Encounter (Signed)
Called to schedule AWV and spoke with daughter who stated pt had passed away last August. FYI! -MM

## 2020-04-11 IMAGING — CT CT HEAD W/O CM
3 of 4 series · 15 of 47 positions shown, 18 images · non-contrast
Comparison: July 16, 2015

CLINICAL DATA: Fall.

EXAM:
CT HEAD WITHOUT CONTRAST
TECHNIQUE: Contiguous axial images were obtained from the base of the skull
through the vertex without intravenous contrast.

[Series 2: head wo · axial · 0.47mm/px · z∈[-162,-27]mm · 9 of 33 slices shown, 12 images]
[im 3/33  brain]
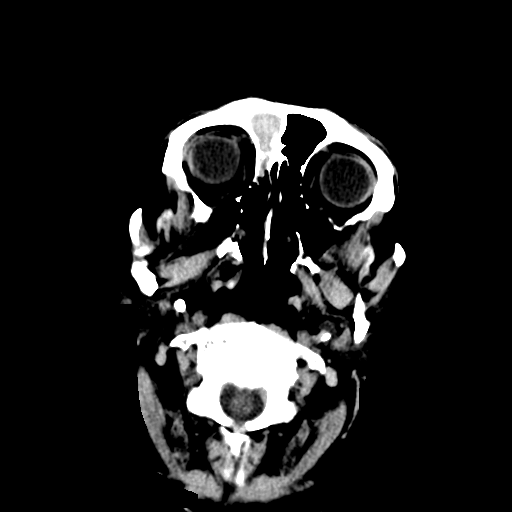
[im 3/33  bone]
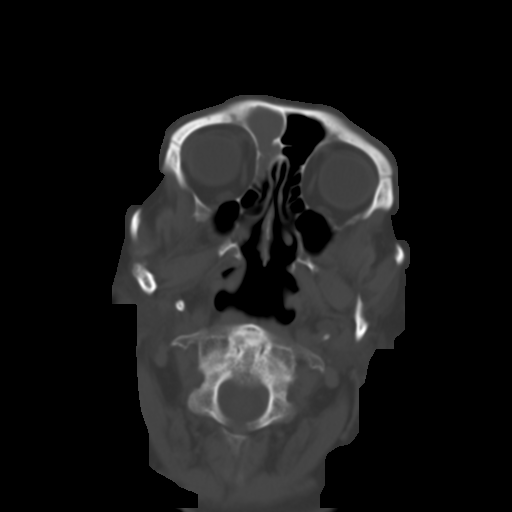
[im 7/33  brain]
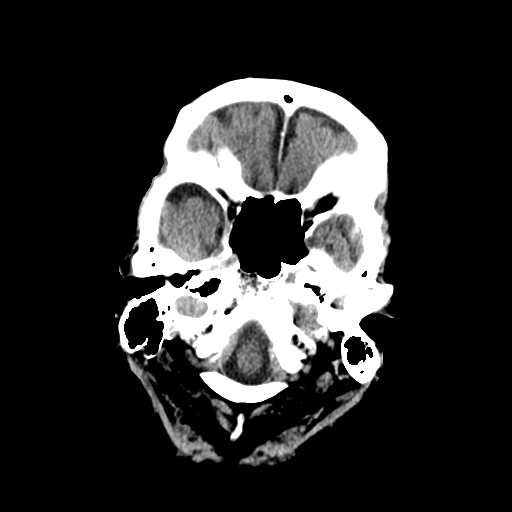
[im 10/33  brain]
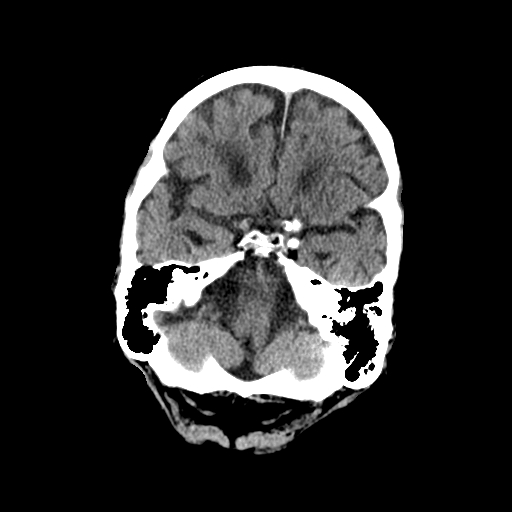
[im 14/33  brain]
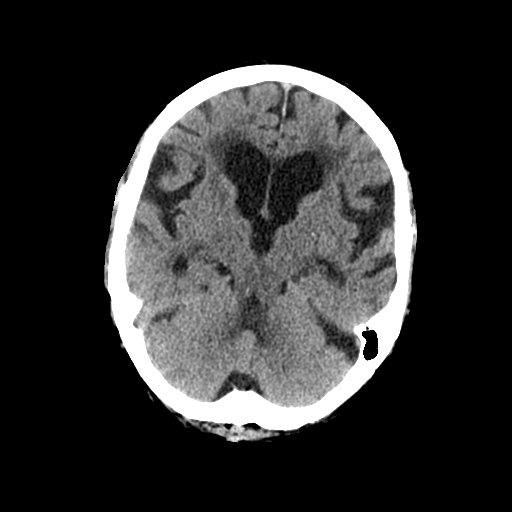
[im 17/33  brain]
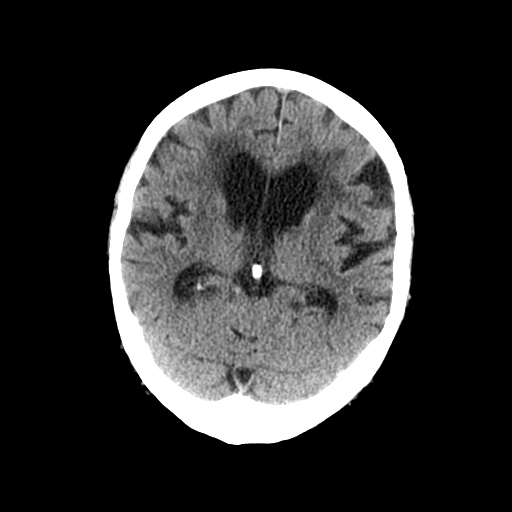
[im 17/33  bone]
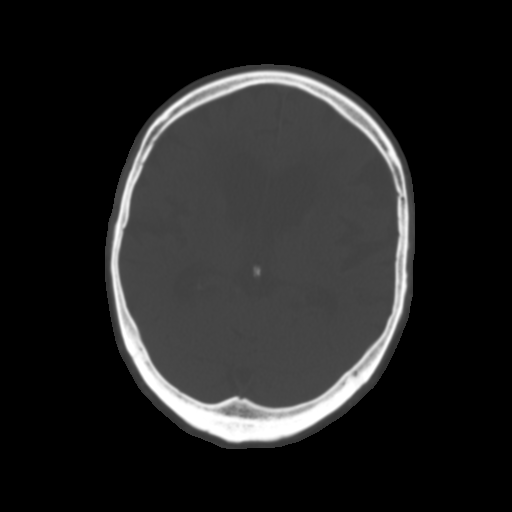
[im 19/33  brain]
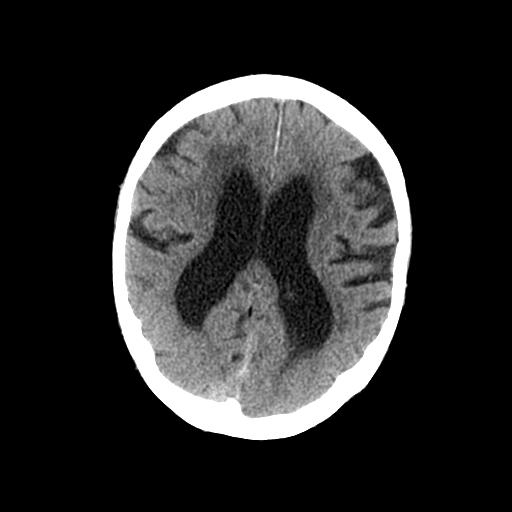
[im 23/33  brain]
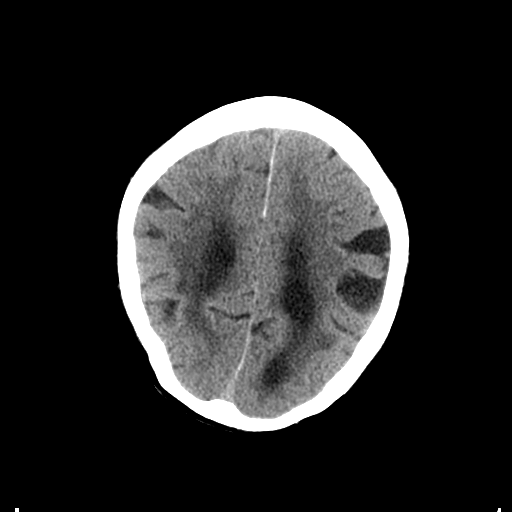
[im 26/33  brain]
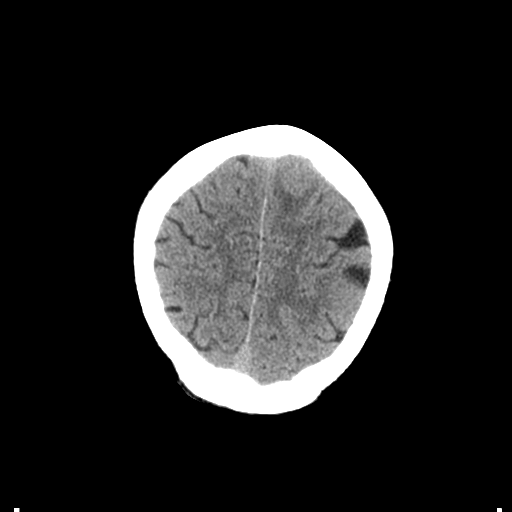
[im 30/33  brain]
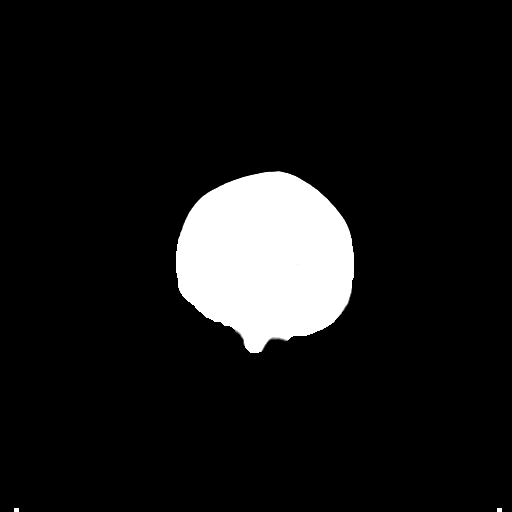
[im 30/33  bone]
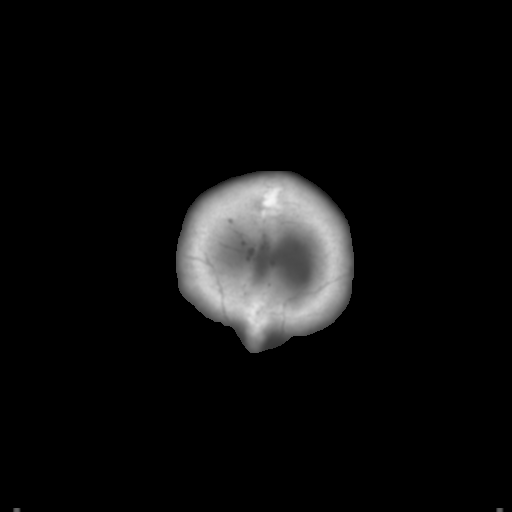

[Series 6: coronal soft tissue · coronal · 0.30mm/px · 3 of 67 slices shown]
[im 23/67  brain]
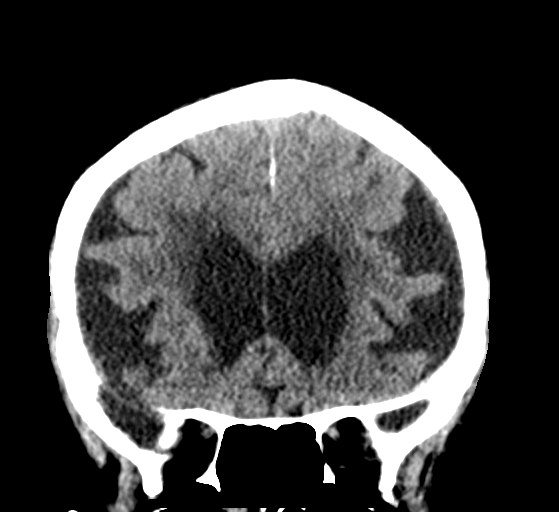
[im 30/67  brain]
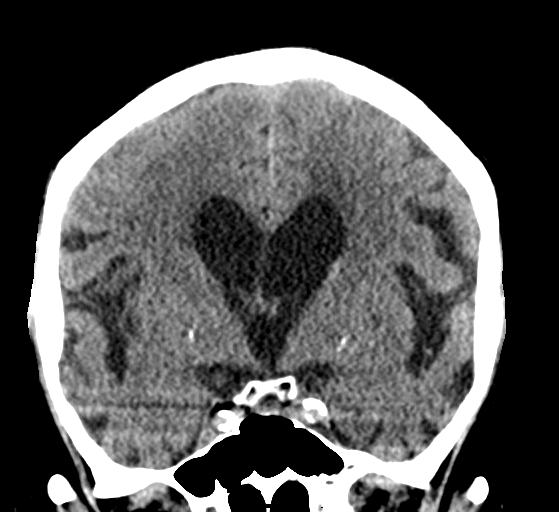
[im 37/67  brain]
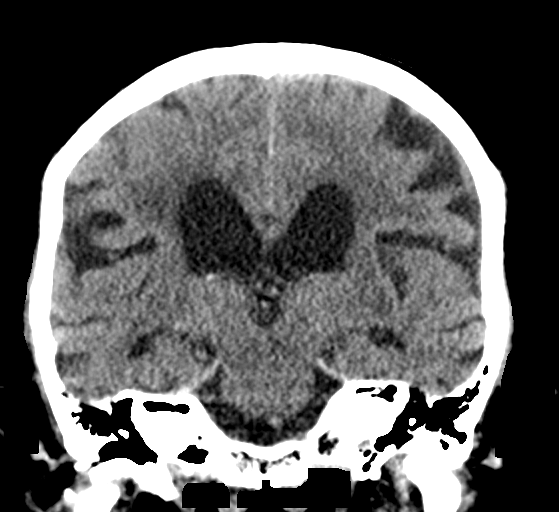

[Series 7: sagittal soft tissue · sagittal · 0.30mm/px · 3 of 51 slices shown]
[im 17/51  brain]
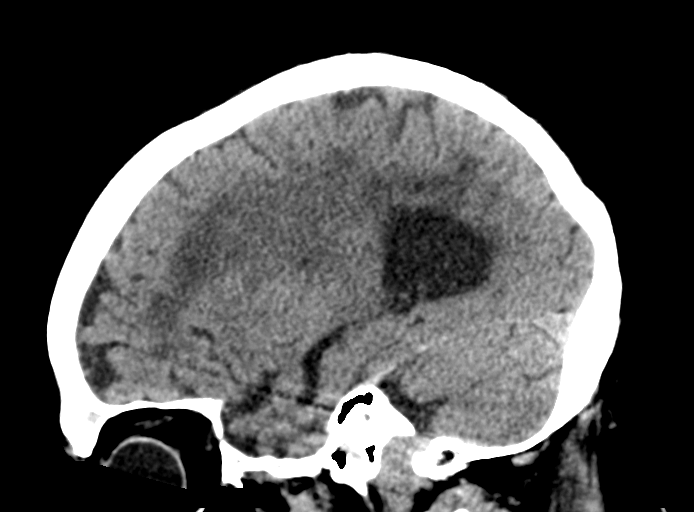
[im 26/51  brain]
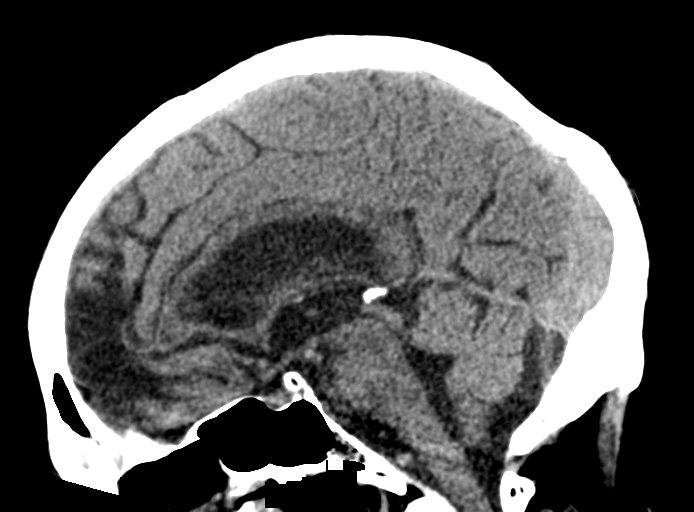
[im 34/51  brain]
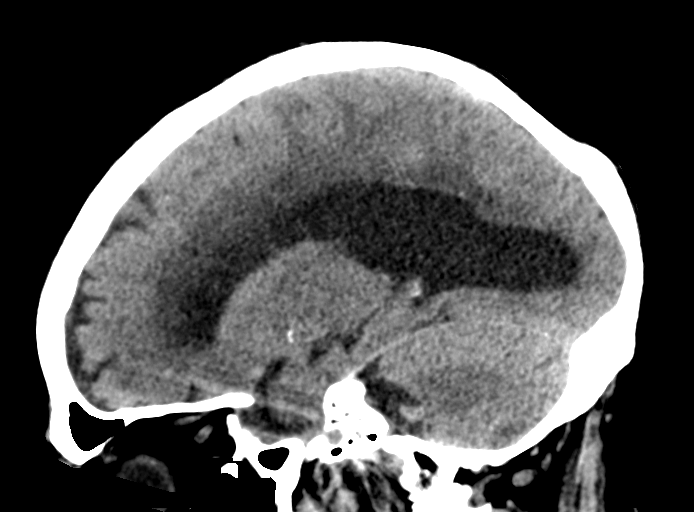

[15 of 47 positions shown; findings below may reference images not displayed]

FINDINGS: Brain: No subdural, epidural, or subarachnoid hemorrhage. Ventricles
and sulci are prominent but stable. Cerebellum, brainstem, and basal
cisterns are normal. No mass effect or midline shift. White matter
changes are stable. No acute cortical ischemia or infarct.

Vascular: Calcified atherosclerosis seen in the intracranial
carotids.

Skull: Normal. Negative for fracture or focal lesion.

Sinuses/Orbits: There is complete opacification of the right frontal
sinus and opacification of several anterior right ethmoid air cells.
These findings are stable. Paranasal sinuses, mastoid air cells, and
middle ears are otherwise normal.

Other: None.
IMPRESSION: No acute intracranial abnormalities. Chronic white matter changes
noted. Stable sinus disease.
